# Patient Record
Sex: Male | Born: 1935 | Race: White | Hispanic: No | Marital: Single | Smoking: Never smoker
Health system: Southern US, Community
[De-identification: ages and names within clinical notes are randomized; demographics above are authoritative.]

## PROBLEM LIST (undated history)

## (undated) DIAGNOSIS — IMO0001 Reserved for inherently not codable concepts without codable children: Secondary | ICD-10-CM

## (undated) DIAGNOSIS — J40 Bronchitis, not specified as acute or chronic: Secondary | ICD-10-CM

## (undated) DIAGNOSIS — J449 Chronic obstructive pulmonary disease, unspecified: Secondary | ICD-10-CM

## (undated) DIAGNOSIS — K649 Unspecified hemorrhoids: Secondary | ICD-10-CM

## (undated) DIAGNOSIS — J45909 Unspecified asthma, uncomplicated: Secondary | ICD-10-CM

## (undated) DIAGNOSIS — E119 Type 2 diabetes mellitus without complications: Secondary | ICD-10-CM

## (undated) DIAGNOSIS — R3911 Hesitancy of micturition: Secondary | ICD-10-CM

## (undated) DIAGNOSIS — R351 Nocturia: Secondary | ICD-10-CM

## (undated) DIAGNOSIS — K219 Gastro-esophageal reflux disease without esophagitis: Secondary | ICD-10-CM

## (undated) HISTORY — PX: NOSE SURGERY: SHX723

## (undated) HISTORY — DX: Type 2 diabetes mellitus without complications: E11.9

## (undated) HISTORY — PX: BACK SURGERY: SHX140

## (undated) HISTORY — DX: Chronic obstructive pulmonary disease, unspecified: J44.9

---

## 2005-08-30 ENCOUNTER — Ambulatory Visit (HOSPITAL_COMMUNITY): Admission: RE | Admit: 2005-08-30 | Discharge: 2005-08-30 | Payer: Self-pay | Admitting: Family Medicine

## 2006-07-03 ENCOUNTER — Ambulatory Visit (HOSPITAL_COMMUNITY): Admission: RE | Admit: 2006-07-03 | Discharge: 2006-07-03 | Payer: Self-pay | Admitting: Family Medicine

## 2006-12-04 ENCOUNTER — Ambulatory Visit (HOSPITAL_COMMUNITY): Admission: RE | Admit: 2006-12-04 | Discharge: 2006-12-04 | Payer: Self-pay | Admitting: Otolaryngology

## 2007-01-06 ENCOUNTER — Ambulatory Visit (HOSPITAL_COMMUNITY): Admission: RE | Admit: 2007-01-06 | Discharge: 2007-01-06 | Payer: Self-pay | Admitting: Otolaryngology

## 2007-02-28 ENCOUNTER — Ambulatory Visit (HOSPITAL_COMMUNITY): Admission: RE | Admit: 2007-02-28 | Discharge: 2007-02-28 | Payer: Self-pay | Admitting: Otolaryngology

## 2007-05-26 ENCOUNTER — Ambulatory Visit (HOSPITAL_COMMUNITY): Admission: RE | Admit: 2007-05-26 | Discharge: 2007-05-26 | Payer: Self-pay | Admitting: *Deleted

## 2007-06-02 ENCOUNTER — Ambulatory Visit (HOSPITAL_BASED_OUTPATIENT_CLINIC_OR_DEPARTMENT_OTHER): Admission: RE | Admit: 2007-06-02 | Discharge: 2007-06-02 | Payer: Self-pay | Admitting: Otolaryngology

## 2007-06-02 ENCOUNTER — Encounter (INDEPENDENT_AMBULATORY_CARE_PROVIDER_SITE_OTHER): Payer: Self-pay | Admitting: Otolaryngology

## 2008-08-06 IMAGING — CT CT PARANASAL SINUSES LIMITED
1 series · 10 of 12 positions shown, 13 images · non-contrast
Comparison: 12/04/06.

CLINICAL DATA: Followup chronic sinusitis and polyposis.  
 LIMITED CT OF PARANASAL SINUSES:
TECHNIQUE: Limited axial CT images were obtained through the paranasal sinuses without intravenous contrast.

[Series 3: sinus supine 5.0 h31s · axial · 0.38mm/px · z∈[-4,+86]mm · 10 of 12 slices shown, 13 images]
[im 2/12  brain]
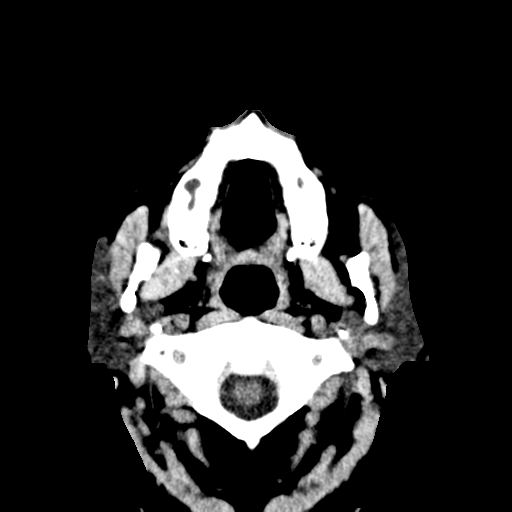
[im 2/12  bone]
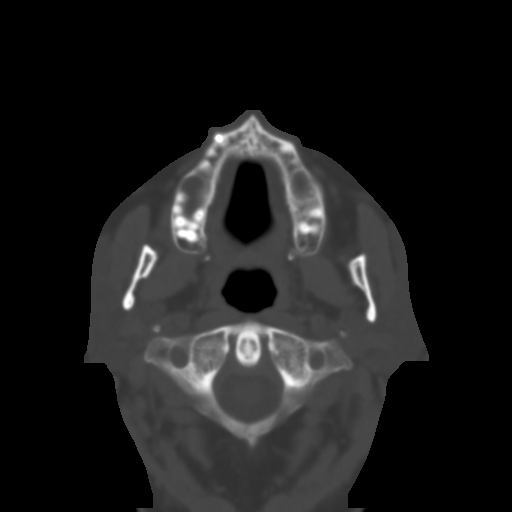
[im 3/12  bone]
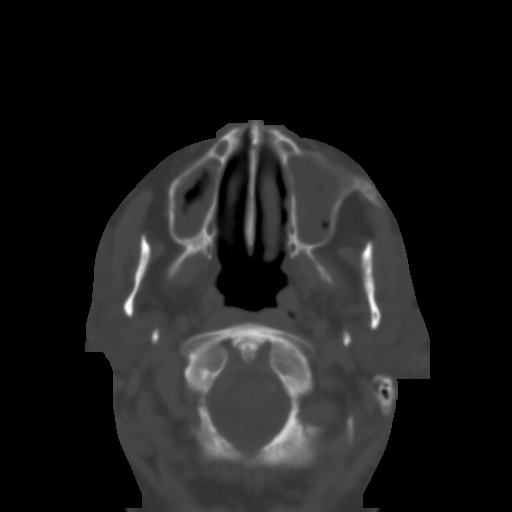
[im 4/12  bone]
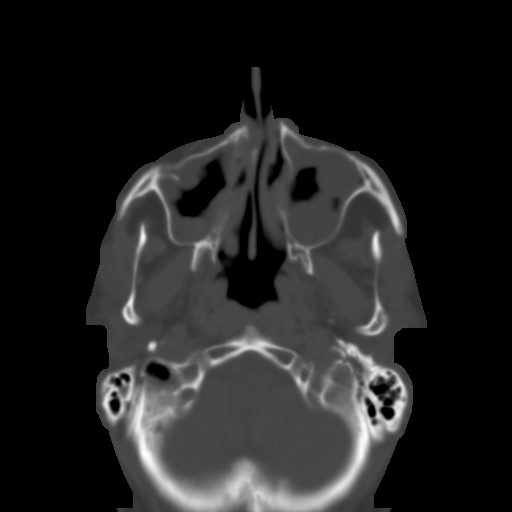
[im 5/12  bone]
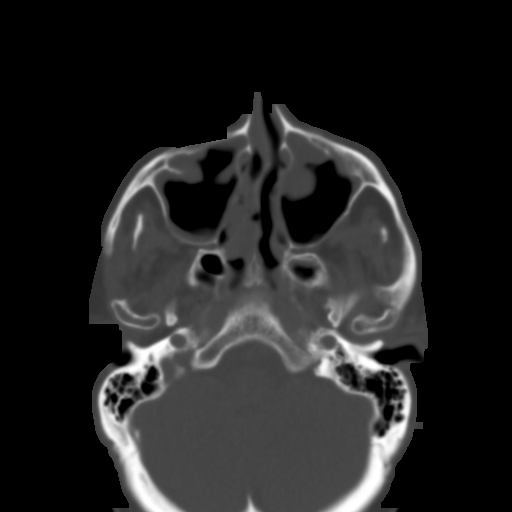
[im 6/12  brain]
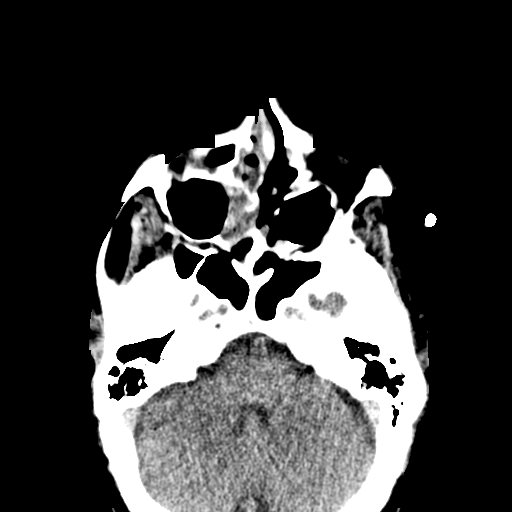
[im 6/12  bone]
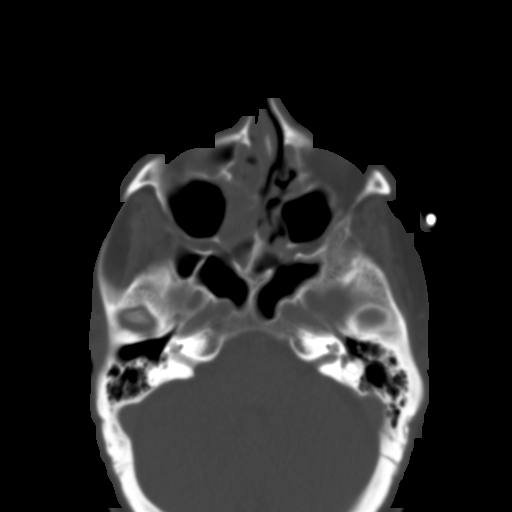
[im 7/12  bone]
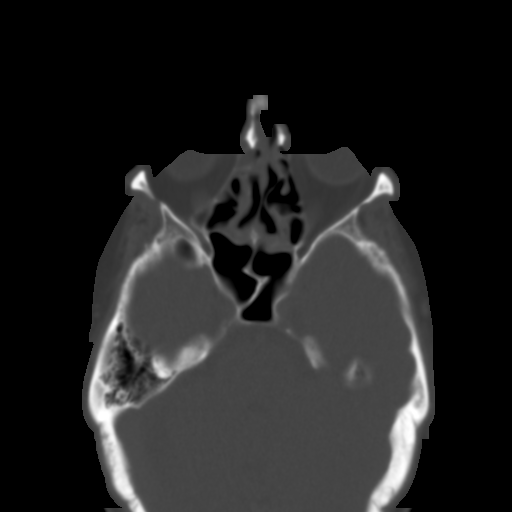
[im 8/12  bone]
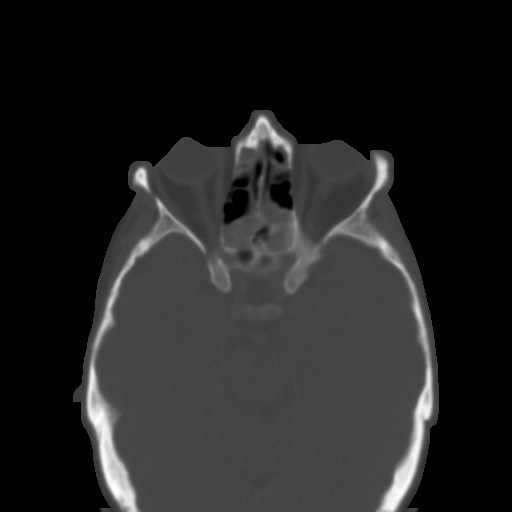
[im 9/12  bone]
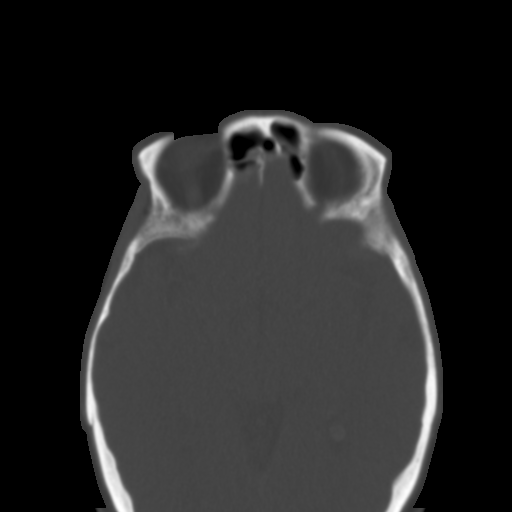
[im 10/12  brain]
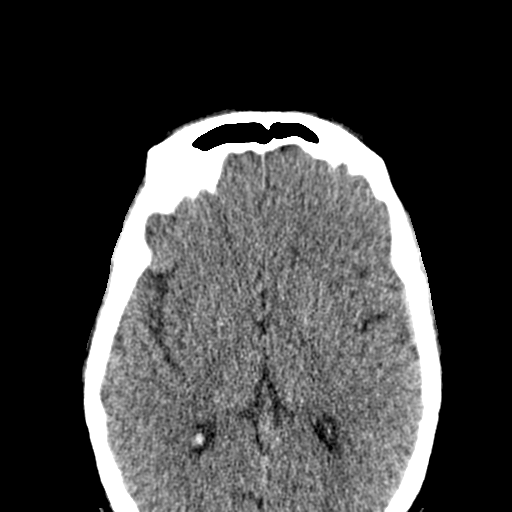
[im 10/12  bone]
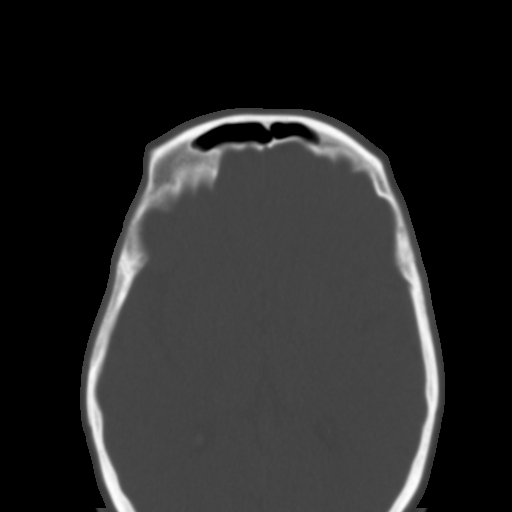
[im 11/12  bone]
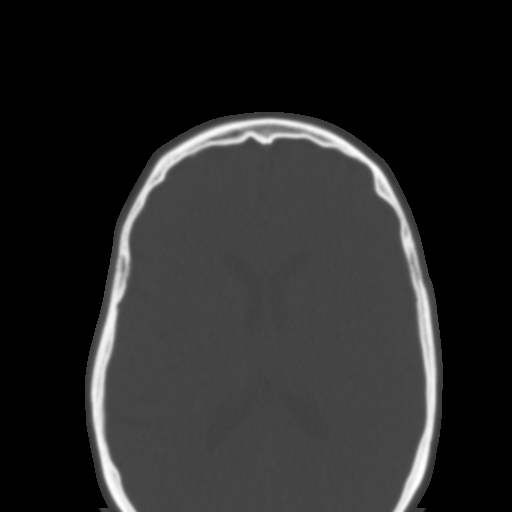

[10 of 12 positions shown; findings below may reference images not displayed]

FINDINGS: Diffuse mucosal thickening is again seen involving the maxillary and ethmoid sinuses bilaterally with multiple rounded areas suggesting mucous retention cysts or polyps.  This has not significantly changed in appearance since the prior study.  Mild mucosal thickening is seen involving the right sphenoid sinus, which is stable.  Frontal sinuses remain clear.  
 There is no evidence of sinus air fluid levels.  The mastoids are clear.  Nasal septal deviation to the right is again noted, as well as old left orbital and facial bone fractures.
IMPRESSION: No significant change in chronic sinusitis, mainly involving the maxillary and ethmoid sinuses bilaterally.  Probable sinus polyposis versus mucous retention cysts again noted.

## 2008-09-28 IMAGING — CT CT PARANASAL SINUSES LIMITED
1 series · 11 of 13 positions shown, 14 images · non-contrast
Comparison: none

HISTORY: Sinusitis, followup after treatment

[Series 4: sinus supine 5.0 h31s · axial · 0.36mm/px · z∈[+61,+161]mm · 11 of 13 slices shown, 14 images]
[im 2/13  brain]
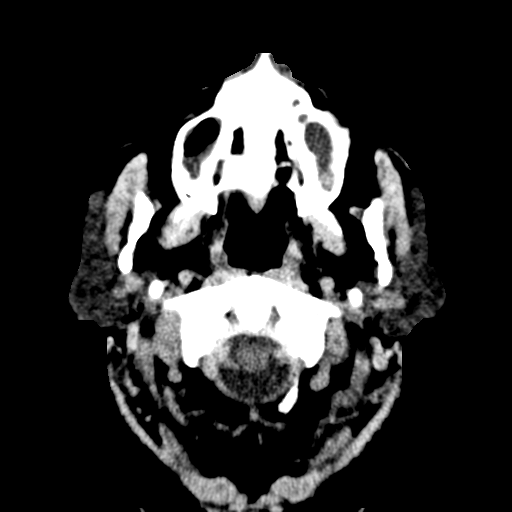
[im 2/13  bone]
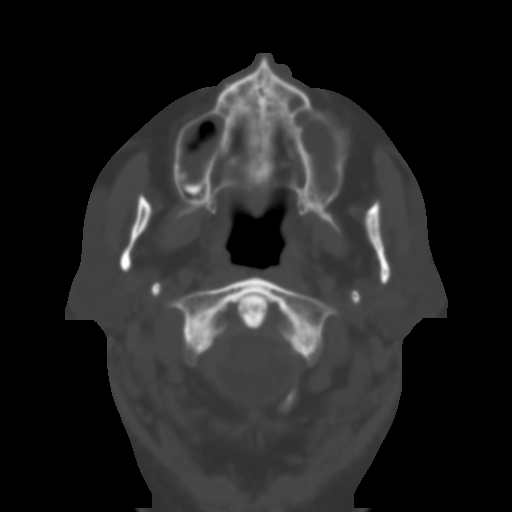
[im 3/13  bone]
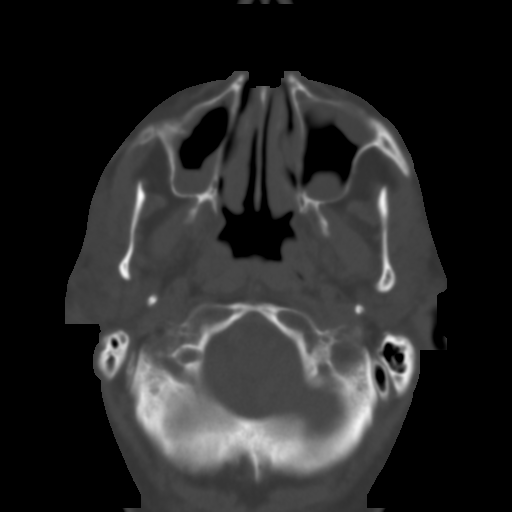
[im 4/13  bone]
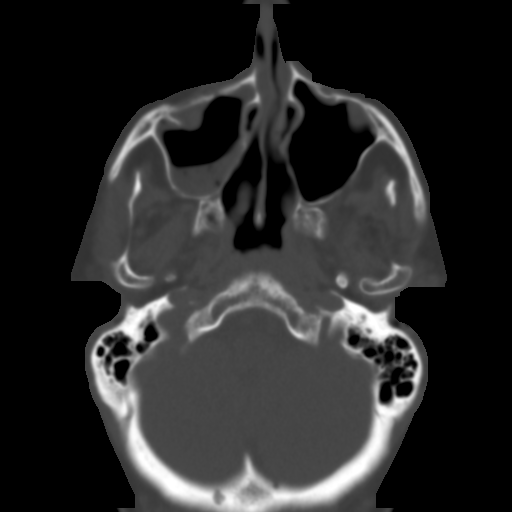
[im 5/13  bone]
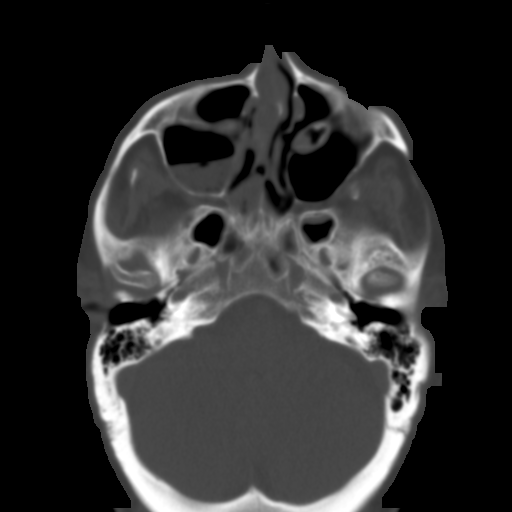
[im 6/13  brain]
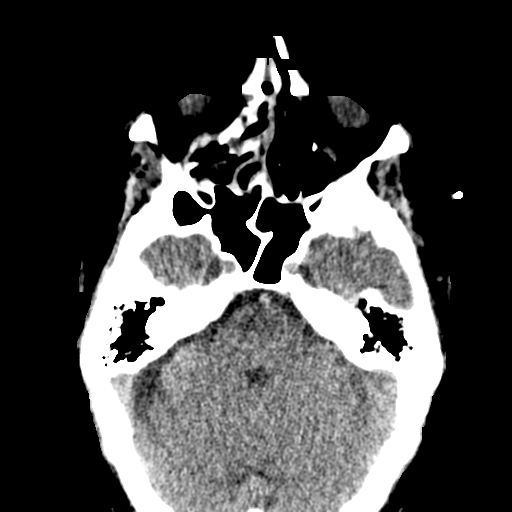
[im 6/13  bone]
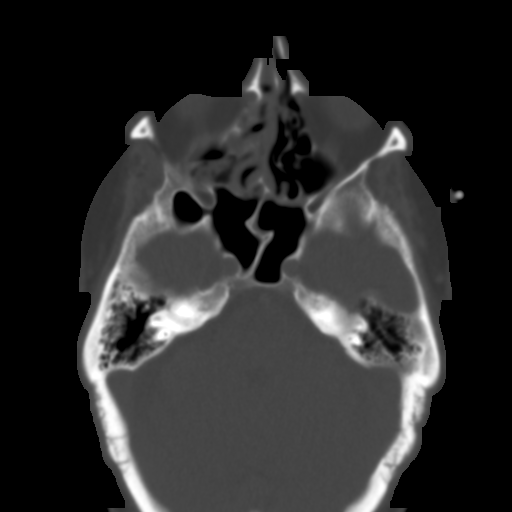
[im 7/13  bone]
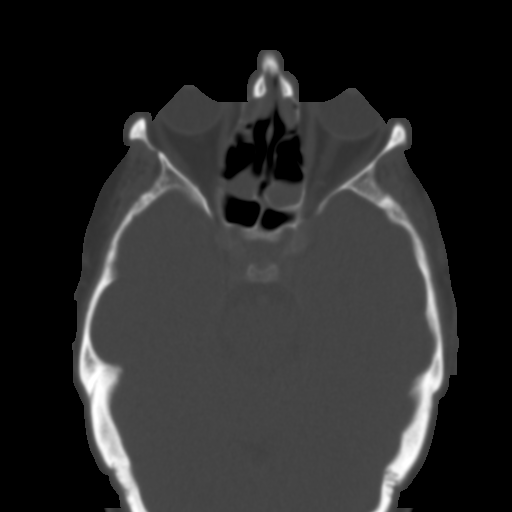
[im 8/13  bone]
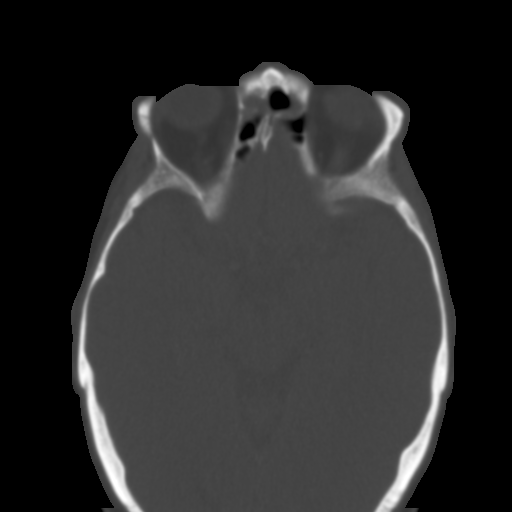
[im 9/13  bone]
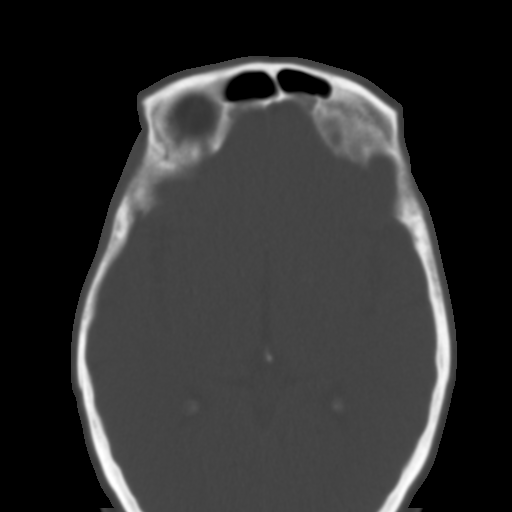
[im 10/13  brain]
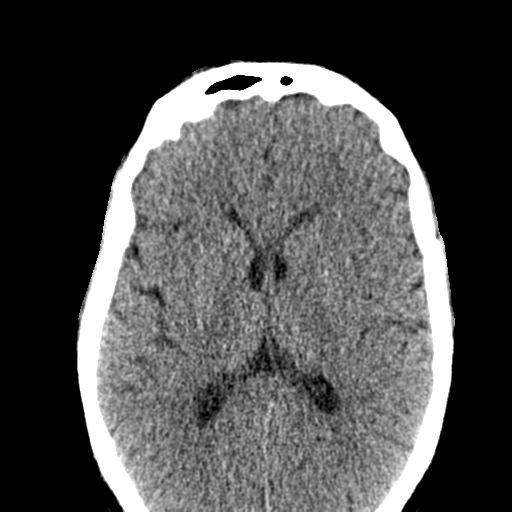
[im 10/13  bone]
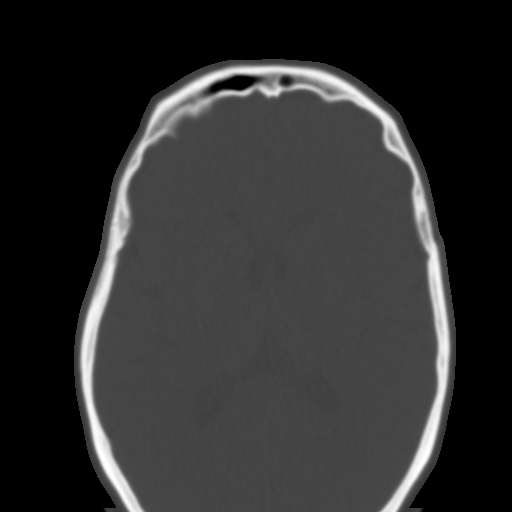
[im 11/13  bone]
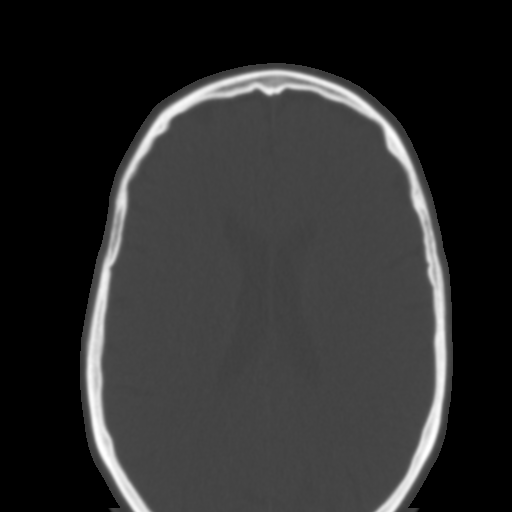
[im 12/13  bone]
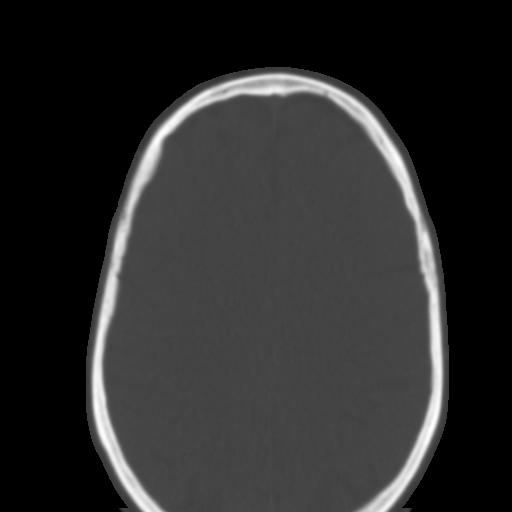

[11 of 13 positions shown; findings below may reference images not displayed]

CT SINUSES LIMITED WITHOUT CONTRAST:

Multidetector noncontrast noncontiguous axial CT images of the paranasal sinuses
performed.
Comparison 01/06/2007.

Significant mucosal thickening in left maxillary sinus with sinus air-fluid
level.
Nodular mucosal thickening or mucosal retention cysts vs. polyps in right
maxillary sinus.
Persistent mucosal thickening and partial opacification of ethmoid air cells
bilaterally.
Minimal mucosal thickening in sphenoid sinus, improved.
No fracture or bone destruction.
Mastoid air cells and middle ear cavities appear clear.
IMPRESSION: Less mucosal thickening in right maxillary sinus and in ethmoid air cells since
previous study.
Partial opacification of ethmoid air cells and maxillary sinuses bilaterally
with new air-fluid level in left maxillary sinus since previous exam.

## 2010-11-14 NOTE — Op Note (Signed)
NAMEBRYSUN, ESCHMANN NO.:  [id number]   MEDICAL RECORD NO.:  [id number]          PATIENT TYPE:  AMB   LOCATION:  DSC                          FACILITY:  [facility]   PHYSICIAN:  Karol T. Lazarus Salines, M.D. DATE OF BIRTH:  [birth year]   DATE OF PROCEDURE:  06/02/2007  DATE OF DISCHARGE:                               OPERATIVE REPORT   PREOPERATIVE DIAGNOSES:  1. Nasal septal deviation with obstruction.  2. Bilateral hypertrophic inferior turbinates with obstruction.  3. Left piriform aperture stenosis posttraumatic.  4. Bilateral chronic pansinusitis, polypoid.   POSTOPERATIVE DIAGNOSES:  1. Nasal septal deviation with obstruction.  2. Bilateral hypertrophic inferior turbinates with obstruction.  3. Left piriform aperture stenosis posttraumatic.  4. Bilateral chronic pansinusitis, polypoid.   PROCEDURES PERFORMED:  1. Nasal septoplasty.  2. Bilateral SMR inferior turbinates.  3. Bilateral endoscopic complete ethmoidectomy.  4. Bilateral endoscopic antrostomy.  5. Left piriform aperture enlargement.   SURGEON:  Gloris Manchester. Lazarus Salines, M.D.   ANESTHESIA:  General orotracheal.   BLOOD LOSS:  25 mL.   COMPLICATIONS:  None.   FINDINGS:  An overall narrow and rightward deviated external nasal  dorsum.  Internally, a rightward septal deviation with a prominent  leftward maxillary crest and chondroethmoidal spur.  Very large inferior  turbinates.  Narrow middle turbinates.  Polypoid mucosal thickening  throughout the ethmoid block and maxillary sinuses on both sides.  Some  internal rotation and narrowing of the lateral left piriform aperture.   PROCEDURE:  With the patient in the comfortable supine position, general  orotracheal anesthesia was induced without difficulty.  At an  appropriate level, the patient was placed in a semisitting position.  The head was rotated to the right.  A saline moistened throat pack was  placed.  Nasal vibrissae were trimmed.   Xylocaine  4% with phenylephrine solution was applied on cotton pledgets  to both sides of the nasal septum.  Xylocaine 1% with 1:100,000  epinephrine, 12 mL total, was infiltrated into the submucoperichondrial  plane of the septum on both side, into the nasal spine region, into the  anterior floor of the nose on both sides, into the membranous columella,  and into the anterior pole of the inferior turbinate on each side.  Several minutes were allowed for this to take effect.  A sterile  preparation and draping of the mid face was accomplished.   The materials were removed from the nose and proved to be intact and  correct in number.  The findings were as described above.  A small right  sided floor incision was executed.  A left hemitransfixion incision was  executed and carried down to a floor incision.  Floor tunnels were  elevated on both sides, carried posteriorly, brought medial to the  maxillary crest and brought forward.  The submucoperichondrial plane of  the left septum was elevated, carried up to the dorsum of the nose, back  onto the perpendicular blade and brought downward and communicated with  the floor tunnel.  There was a small linear rent overlying the  chondroethmoid spur.   The  chondroethmoid junction was identified and opened with a caudal  elevator and the opposite submucoperiosteal plane was elevated up to the  dorsum of the nose and then carried down with the perpendicular plate.  The superior perpendicular plate was lysed with an open Jansen-Middleton  forceps.  The mid portion was dissected free, submucosally rocked free  and delivered with closed Jansen-Middleton forceps and Takahashi  forceps.  The chondroethmoid spur was carefully removed.  The posterior-  inferior corner of the quadrangular cartilage was removed and the  maxillary crest which was deviated towards the left posterior to this  point was removed with a mallet and osteotome.  Two millimeters of the   inferior caudal strut were removed to allow the septum to trap door into  the midline.  This completed the septoplasty.  The septum was secured to  the nasal spine with a figure-of-eight 4-0 PDS suture.  The flaps were  not closed at this point.   Beginning on the right side, additional Xylocaine with phenylephrine  pledgets were placed into the middle meatus and between the middle  turbinate and the septum.  Several minutes were allowed for this to take  effect.  Using a 25 gauge spinal needle, additional 1% Xylocaine with  1:100,000 epinephrine was infiltrated into the middle turbinate and into  the lateral wall of the nose.   The materials were removed from the nose and observed to be intact and  correct in number.  The 0 degree nasal endoscope was introduced.  Using  a sharp sickle knife, the uncinate process was identified and then lysed  and removed with a power debrider.  The anterior face of the bulla  ethmoidalis is opened with a punch forceps and the bony partitions were  removed with punch forceps and the power debrider.  The horizontal  lamella of the middle turbinate was allowed to remain in place, although  the turbinate itself was quite floppy.  The dissection was carried  inferiorly and medially through the vertical lamella of the middle  turbinate and back into the posterior ethmoid cells, working from  medially to laterally and from inferiorly to superiorly, bony partitions  were broken down.  A large natural ostium was identified with some  polypoid changes and this was opened in a posterior to anterior  direction and cleaned with a power debrider.  Using this as the landmark  for the lamina papyracea, the dissection was carried up along the lamina  which was not violated to the level of the fovea ethmoidalis and the  bony partitions were removed.   Switching to a 30-degree telescope to allow better visualization in the  agger nasi region, the cells were broken down  with a suction tip and  bone partitions and mucosa were removed with punch forceps and with the  power debrider.  The inferior aspect of the nasal frontal recess was  identified as patent but was not further disturbed.  At this point, bone  chips were carefully removed.  Hemostasis was spontaneous.  There was  slight bulging of the thin lamina papyracea bone with compression of the  orbit, but there was no actual orbital fat and there was an intact bone  covering throughout.  At this point, the right sinus surgery portion of  the procedure was completed.   The left side was performed in an identical fashion removing the  uncinate process, removing some polyps in the middle meatus, opening the  bulla ethmoidalis but leaving the horizontal  lamella of the middle  turbinate intact, working through the vertical lamella and into the  posterior ethmoid cells, opening the antrostomy posteriorly and  anteriorly and then working from known landmarks up to the fovea  ethmoidalis and along the lamina papyracea, and then switching  telescopes, the agger nasi cells were also opened.  Once again, no  evidence of spinal fluid leak, damage to the fovea or damage to the  lamina papyracea was noted on either side.  No attempt was made to open  the sphenoid on either side.  Hemostasis was spontaneous.  A cotton  pledget with Xylocaine/phenylephrine was placed in the middle meatus.   At this point, the septal tunnels were suctioned clear.  There were  several rents on the right side.  The septal incisions were closed with  interrupted 4-0 chromic suture.   Just before completing the septoplasty, the inferior turbinates were  infiltrated with additional 1% Xylocaine with 1:100,000 epinephrine.  Following this, just behind the nasal valve, the anterior hood of the  right inferior turbinate was lysed and the medial mucosa of the  turbinate was incised in an anterior upsloping fashion and a laterally  based  flap was developed.  The turbinate was infractured.  The turbinate  was resected in a posterior downsloping fashion taking turbinate bone  and lateral mucosa and leaving most of the medial mucosa.  There was a  bulbous posterior pole which was suctioned coagulated as were the cut  mucosal edges.  Bony spicules were removed for more prompt healing.  The  flap was laid back down, the turbinate was outfracture and this side was  completed.  The left side was done in identical fashion.   After completing the left turbinectomy, the soft tissue was dissected  away from the piriform aperture and some stenosis in this area was  removed with a mallet and osteotome and the bone particles were  discarded.  The soft tissue fell back down across this area of bone  resection.   At this point, the 0.040 reinforced Silastic splints were fashioned and  placed against the septum on both sides and secured there too with a 3-0  Ethilon stitch.  A triple thickness Telfa pack impregnated with  bacitracin ointment with a 2-0 silk tag was placed into the middle  meatus on both sides.  An additional pack was placed between the  turbinate and the septum on both sides for stabilization of the septum  and for hemostasis.  Hemostasis was observed.  Note that the last cotton  pledgets had been removed and the ethmoid blocks reinspected prior to  packing.  At this point, the procedure was completed.  The pharynx was  suctioned clean and the throat pack was removed.  The patient was  returned to anesthesia, awakened, extubated and transferred to recovery  in stable condition.   COMMENT:  A 75 year old white male with a long history of recurrent  sinusitis and a history of poor nasal airway, made worse following a  facial trauma roughly one year ago with several indications for today's  procedure.  Anticipate a routine postoperative recovery with attention  to ice, elevation, analgesia, antibiosis.  We will remove the  nasal  packing in 1-2 days and then institute nasal hygiene measures.  I would  like to leave the Silastic splints in place approximately 14 days and  then will begin sinus cleaning.  Given low anticipated risk of  postanesthetic or postsurgical complications, feel an outpatient venue  is appropriate.      Gloris Manchester. Lazarus Salines, M.D.  Electronically Signed     KTW/MEDQ  D:  06/02/2007  T:  06/02/2007  Job:  [id number]   cc:   Zola Button T. Lazarus Salines, M.D.  Patrica Duel, M.D.  Janith Lima, MD

## 2014-02-11 ENCOUNTER — Encounter (HOSPITAL_COMMUNITY): Payer: Self-pay | Admitting: Emergency Medicine

## 2014-02-11 ENCOUNTER — Emergency Department (HOSPITAL_COMMUNITY)
Admission: EM | Admit: 2014-02-11 | Discharge: 2014-02-11 | Disposition: A | Discharge status: Home / Self Care | Payer: Medicare Other | Attending: Emergency Medicine | Admitting: Emergency Medicine

## 2014-02-11 DIAGNOSIS — R011 Cardiac murmur, unspecified: Secondary | ICD-10-CM | POA: Diagnosis not present

## 2014-02-11 DIAGNOSIS — Z8709 Personal history of other diseases of the respiratory system: Secondary | ICD-10-CM | POA: Insufficient documentation

## 2014-02-11 DIAGNOSIS — H6120 Impacted cerumen, unspecified ear: Secondary | ICD-10-CM | POA: Diagnosis not present

## 2014-02-11 DIAGNOSIS — H9209 Otalgia, unspecified ear: Secondary | ICD-10-CM | POA: Insufficient documentation

## 2014-02-11 DIAGNOSIS — H6123 Impacted cerumen, bilateral: Secondary | ICD-10-CM

## 2014-02-11 HISTORY — DX: Bronchitis, not specified as acute or chronic: J40

## 2014-02-11 MED ORDER — NEOMYCIN-POLYMYXIN-HC 1 % OT SOLN
3.0000 [drp] | Freq: Once | OTIC | Status: AC
  Administered 2014-02-11: 3 [drp] via OTIC
  Filled 2014-02-11: qty 10

## 2014-02-11 NOTE — ED Notes (Signed)
Removed several large chunks of cerumen from both ears. Irrigation was successful; PA was able to visualize tympanic membrane bilaterally.

## 2014-02-11 NOTE — ED Provider Notes (Signed)
Medical screening examination/treatment/procedure(s) were performed by non-physician practitioner and as supervising physician I was immediately available for consultation/collaboration.   EKG Interpretation None       Nat Christen, MD 02/11/14 1527

## 2014-02-11 NOTE — Discharge Instructions (Signed)
Please drop 3 drops of the Cortisporin otic suspension in both ears with breakfast lunch dinner and bedtime for the next 5-7 days. Use Tylenol for any discomfort. Please return to the emergency department if any high fever, unusual pain, or changes in your condition. Cerumen Impaction A cerumen impaction is when the wax in your ear forms a plug. This plug usually causes reduced hearing. Sometimes it also causes an earache or dizziness. Removing a cerumen impaction can be difficult and painful. The wax sticks to the ear canal. The canal is sensitive and bleeds easily. If you try to remove a heavy wax buildup with a cotton tipped swab, you may push it in further. Irrigation with water, suction, and small ear curettes may be used to clear out the wax. If the impaction is fixed to the skin in the ear canal, ear drops may be needed for a few days to loosen the wax. People who build up a lot of wax frequently can use ear wax removal products available in your local drugstore. SEEK MEDICAL CARE IF:  You develop an earache, increased hearing loss, or marked dizziness. Document Released: 07/26/2004 Document Revised: 09/10/2011 Document Reviewed: 09/15/2009 [facility] Patient Information 2015 [place], [place]. This information is not intended to replace advice given to you by your health care provider. Make sure you discuss any questions you have with your health care provider.

## 2014-02-11 NOTE — ED Provider Notes (Signed)
CSN: [id number]     Arrival date & time 02/11/14  0903 History   First MD Initiated Contact with Patient 02/11/14 0935     Chief Complaint  Patient presents with  . Ear Problem     (Consider location/radiation/quality/duration/timing/severity/associated sxs/prior Treatment) HPI Comments: This is a 78 year old male who presents to the emergency department with complaint of" both ears are stopped up". The patient states for the last 4 days he's been having a" a muffled sound sensation" he states that he has particular problems with some things are very low pitch. He noted a small amount of drainage, but he did not know of blood or pus. He's not had any high fever. There's been no unusual headache. His been no pain or redness that the patient has noticed behind the ears. He has not had any recent operations or procedures involving the ears. Patient states he has not had any change in medications, and has not had any medications that he was told could be toxic to his ears.  The history is provided by the patient.    Past Medical History  Diagnosis Date  . Bronchitis    Past Surgical History  Procedure Laterality Date  . Back surgery     No family history on file. History  Substance Use Topics  . Smoking status: Never Smoker   . Smokeless tobacco: Not on file  . Alcohol Use: No    Review of Systems  Constitutional: Negative for activity change.       All ROS Neg except as noted in HPI  HENT: Positive for ear pain and sinus pressure. Negative for nosebleeds.   Eyes: Negative for photophobia and discharge.  Respiratory: Positive for cough. Negative for shortness of breath and wheezing.   Cardiovascular: Negative for chest pain and palpitations.  Gastrointestinal: Negative for abdominal pain and blood in stool.  Genitourinary: Negative for dysuria, frequency and hematuria.  Musculoskeletal: Negative for arthralgias, back pain and neck pain.  Skin: Negative.   Neurological: Negative  for dizziness, seizures and speech difficulty.  Psychiatric/Behavioral: Negative for hallucinations and confusion.      Allergies  Aspirin  Home Medications   Prior to Admission medications   Medication Sig Start Date End Date Taking? Authorizing Provider  albuterol (PROVENTIL HFA;VENTOLIN HFA) 108 (90 BASE) MCG/ACT inhaler Inhale 2 puffs into the lungs every 6 (six) hours as needed for wheezing or shortness of breath.   Yes Historical Provider, MD   BP 149/77  Pulse 80  Temp(Src) 97.6 F (36.4 C) (Oral)  Resp 16  Ht 6' (1.829 m)  Wt 160 lb (72.576 kg)  BMI 21.70 kg/m2  SpO2 94% Physical Exam  Nursing note and vitals reviewed. Constitutional: He is oriented to person, place, and time. He appears well-developed and well-nourished.  Non-toxic appearance.  HENT:  Head: Normocephalic.  Right Ear: Tympanic membrane and external ear normal.  Left Ear: Tympanic membrane and external ear normal.  There is no pain with movement of the external ear. There's no redness or swelling behind the right or left year. The portion of the external auditory canal that can be seen as without problem.  Eyes: EOM and lids are normal. Pupils are equal, round, and reactive to light.  Neck: Normal range of motion. Neck supple. Carotid bruit is not present.  Cardiovascular: Normal rate, regular rhythm, intact distal pulses and normal pulses.   Murmur heard. Pulmonary/Chest: Breath sounds normal. No respiratory distress.  Abdominal: Soft. Bowel sounds are normal. There is  no tenderness. There is no guarding.  Musculoskeletal: Normal range of motion.  Lymphadenopathy:       Head (right side): No submandibular adenopathy present.       Head (left side): No submandibular adenopathy present.    He has no cervical adenopathy.  Neurological: He is alert and oriented to person, place, and time. He has normal strength. No cranial nerve deficit or sensory deficit.  Skin: Skin is warm and dry.  Psychiatric: He  has a normal mood and affect. His speech is normal.    ED Course Both ears irrigated by nursing staff.   Patient seen with me by Dr. Lacinda Axon.   Procedures (including critical care time) Labs Review Labs Reviewed - No data to display  Imaging Review No results found.   EKG Interpretation None      MDM Vital signs are well within normal limits. Pulse oximetry is 94% on room air. Within normal limits by my interpretation.  After the ears were ear gated and significant wax impaction was removed, the right external auditory canal was within normal limits. The tympanic membrane was without problem.  There is a small scratch in the left external auditory canal. A significant amount of the wax impaction has been removed. Only a portion of the tympanic membrane could be seen, and it was it was without problem. The patient states that he can hear much better and that he feels much better.  The plan at this time is for the patient to be given Cortisporin otic suspension 3 drops 3 times daily over the next 5-7 days. The patient is advised to return to the emergency department immediately if any changes, problems, or concerns.    Final diagnoses:  None    *I have reviewed nursing notes, vital signs, and all appropriate lab and imaging results for this patient.Lenox Ahr, PA-C 02/11/14 1125

## 2014-02-11 NOTE — ED Notes (Signed)
Pt states "Both ears are stopped up" x 3-4 days. States, "only a little pain" and also states everything sounds muffled. NAD.

## 2015-07-11 DIAGNOSIS — J40 Bronchitis, not specified as acute or chronic: Secondary | ICD-10-CM | POA: Diagnosis not present

## 2015-07-11 DIAGNOSIS — J449 Chronic obstructive pulmonary disease, unspecified: Secondary | ICD-10-CM | POA: Diagnosis not present

## 2015-07-11 DIAGNOSIS — K210 Gastro-esophageal reflux disease with esophagitis: Secondary | ICD-10-CM | POA: Diagnosis not present

## 2015-07-11 DIAGNOSIS — N401 Enlarged prostate with lower urinary tract symptoms: Secondary | ICD-10-CM | POA: Diagnosis not present

## 2015-07-11 DIAGNOSIS — E1165 Type 2 diabetes mellitus with hyperglycemia: Secondary | ICD-10-CM | POA: Diagnosis not present

## 2015-07-27 DIAGNOSIS — R972 Elevated prostate specific antigen [PSA]: Secondary | ICD-10-CM | POA: Diagnosis not present

## 2015-07-27 DIAGNOSIS — R350 Frequency of micturition: Secondary | ICD-10-CM | POA: Diagnosis not present

## 2015-07-27 DIAGNOSIS — R351 Nocturia: Secondary | ICD-10-CM | POA: Diagnosis not present

## 2015-07-27 DIAGNOSIS — N401 Enlarged prostate with lower urinary tract symptoms: Secondary | ICD-10-CM | POA: Diagnosis not present

## 2015-07-27 DIAGNOSIS — Z0000 Encounter for general adult medical examination without abnormal findings: Secondary | ICD-10-CM | POA: Diagnosis not present

## 2015-08-02 ENCOUNTER — Other Ambulatory Visit (HOSPITAL_COMMUNITY): Payer: Self-pay | Admitting: Urology

## 2015-08-02 DIAGNOSIS — R972 Elevated prostate specific antigen [PSA]: Secondary | ICD-10-CM

## 2015-08-04 ENCOUNTER — Encounter: Payer: PPO | Attending: Pulmonary Disease | Admitting: Nutrition

## 2015-08-04 ENCOUNTER — Encounter: Payer: Self-pay | Admitting: Nutrition

## 2015-08-04 VITALS — Ht 72.0 in | Wt 153.0 lb

## 2015-08-04 DIAGNOSIS — IMO0002 Reserved for concepts with insufficient information to code with codable children: Secondary | ICD-10-CM

## 2015-08-04 DIAGNOSIS — E1165 Type 2 diabetes mellitus with hyperglycemia: Secondary | ICD-10-CM | POA: Insufficient documentation

## 2015-08-04 DIAGNOSIS — E118 Type 2 diabetes mellitus with unspecified complications: Secondary | ICD-10-CM

## 2015-08-04 NOTE — Progress Notes (Signed)
Diabetes Self-Management Education  Visit Type: First/Initial  Appt. Start Time: 1500 pm  End Time: 1600  08/05/2015  [name], identified by name and date of birth, is a 80 y.o. male with a diagnosis of Diabetes: Type 2.    ASSESSMENT  Primary concerns today: Diabetes, Type 2 DM, Lives with his sister and brother in law. HIs ster and brother do the cooking and shopping.  A1C 10%. Has cut candy and sweets since finding out about DM. Tests blood sugars in am 120's mg/dl.  Last BS abg 119. Mos:t foods are baked and broiled and some fried. Taking Metformin 500 mg BID. Since he cut out sweets etc, he notes his BS in am are below 120 mg/dl now all the time. Eating two- three meals per day. 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 Was eating a lot of sweets and drinking sodas in the past. Cut them out. 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 Diet has been excessive in carbs at times and insuffient at other times.  Needs more fresh fruits and vegetables and whole grains.  Height 6' (1.829 m), weight 153 lb (69.4 kg). Body mass index is 20.75 kg/(m^2).      Diabetes Self-Management Education - 08/04/15 0830    Visit Information   Visit Type First/Initial   Initial Visit   Diabetes Type Type 2   Are you currently following a meal plan? Yes   What type of meal plan do you follow? low sugar   Are you taking your medications as prescribed? Yes   Date Diagnosed a few months ago.   Health Coping   How would you rate your overall health? Good   Psychosocial Assessment   Patient Belief/Attitude about Diabetes Motivated to manage diabetes   Self-care barriers None   Self-management support Family   Other persons present Patient   Patient Concerns Nutrition/Meal planning;Healthy Lifestyle;Glycemic Control   Special Needs None   Preferred Learning Style No preference indicated   Learning Readiness Ready   How often do you need to have someone help you when you  read instructions, pamphlets, or other written materials from your doctor or pharmacy? 1 - Never   What is the last grade level you completed in school? 12   Complications   Last HgB A1C per patient/outside source 10 %   How often do you check your blood sugar? 1-2 times/day   Fasting Blood glucose range (mg/dL) 70-129   Postprandial Blood glucose range (mg/dL) 70-129   Number of hypoglycemic episodes per month 0   Number of hyperglycemic episodes per week 0   Have you had a dilated eye exam in the past 12 months? Yes   Have you had a dental exam in the past 12 months? Yes   Are you checking your feet? Yes   Dietary Intake   Breakfast eggs, oatmeal, and fruit   Lunch sometimes skips if on farm working; sandwich, Copywriter, advertising, few vegetales, water   Snack (evening) fruit   Beverage(s) water   Exercise   Exercise Type Light (walking / raking leaves)   How many days per week to you exercise? 5   How many minutes per day do you exercise? 30   Total minutes per week of exercise 150   Patient Education   Previous Diabetes Education No   Disease state  Definition of diabetes, type 1 and 2, and the diagnosis of diabetes;Factors that  contribute to the development of diabetes;Explored patient's options for treatment of their diabetes   Nutrition management  Role of diet in the treatment of diabetes and the relationship between the three main macronutrients and blood glucose level;Food label reading, portion sizes and measuring food.;Carbohydrate counting;Reviewed blood glucose goals for pre and post meals and how to evaluate the patients' food intake on their blood glucose level.;Meal timing in regards to the patients' current diabetes medication.;Information on hints to eating out and maintain blood glucose control.;Meal options for control of blood glucose level and chronic complications.   Physical activity and exercise  Role of exercise on diabetes management, blood pressure control and  cardiac health.;Helped patient identify appropriate exercises in relation to his/her diabetes, diabetes complications and other health issue.;Identified with patient nutritional and/or medication changes necessary with exercise.   Medications Reviewed patients medication for diabetes, action, purpose, timing of dose and side effects.   Monitoring Purpose and frequency of SMBG.;Taught/discussed recording of test results and interpretation of SMBG.;Identified appropriate SMBG and/or A1C goals.   Acute complications Taught treatment of hypoglycemia - the 15 rule.   Chronic complications Relationship between chronic complications and blood glucose control;Assessed and discussed foot care and prevention of foot problems   Psychosocial adjustment Identified and addressed patients feelings and concerns about diabetes;Worked with patient to identify barriers to care and solutions   Personal strategies to promote health Lifestyle issues that need to be addressed for better diabetes care   Individualized Goals (developed by patient)   Nutrition Follow meal plan discussed;General guidelines for healthy choices and portions discussed;Adjust meds/carbs with exercise as discussed   Physical Activity Exercise 3-5 times per week   Monitoring  test my blood glucose as discussed;test blood glucose before bed, 3 a.m., and fasting   Reducing Risk examine blood glucose patterns;do foot checks daily;increase portions of nuts and seeds;increase portions of healthy fats   Outcomes   Expected Outcomes Demonstrated interest in learning. Expect positive outcomes   Future DMSE 3-4 months   Program Status Completed      Individualized Plan for Diabetes Self-Management Training:   Learning Objective:  Patient will have a greater understanding of diabetes self-management. Patient education plan is to attend individual and/or group sessions per assessed needs and concerns.   Plan:   Patient Instructions  Goals 1. Follow  Plate Method 2. Increase fresh fruits and vegetables. 3. Drink water or unsweet tea 4. Avoid snacks between meals. 5. Eat 3-4 carb choices per meal. 6. Do not skip meals. 7. Get A1C down to 7% in three months. 8. Take Metformin as directed.    Expected Outcomes:  Demonstrated interest in learning. Expect positive outcomes  Education material provided: Living Well with Diabetes, Meal plan card, My Plate and Carbohydrate counting sheet  If problems or questions, patient to contact team via:  Phone  Future DSME appointment: 3-4 months   .

## 2015-08-04 NOTE — Patient Instructions (Signed)
Goals 1. Follow Plate Method 2. Increase fresh fruits and vegetables. 3. Drink water or unsweet tea 4. Avoid snacks between meals. 5. Eat 3-4 carb choices per meal. 6. Do not skip meals. 7. Get A1C down to 7% in three months. 8. Take Metformin as directed.

## 2015-08-05 ENCOUNTER — Encounter: Payer: Self-pay | Admitting: Nutrition

## 2015-08-19 ENCOUNTER — Ambulatory Visit (HOSPITAL_COMMUNITY)
Admission: RE | Admit: 2015-08-19 | Discharge: 2015-08-19 | Disposition: A | Discharge status: Home / Self Care | Payer: PPO | Source: Ambulatory Visit | Attending: Urology | Admitting: Urology

## 2015-08-19 DIAGNOSIS — N429 Disorder of prostate, unspecified: Secondary | ICD-10-CM | POA: Insufficient documentation

## 2015-08-19 DIAGNOSIS — N400 Enlarged prostate without lower urinary tract symptoms: Secondary | ICD-10-CM | POA: Diagnosis not present

## 2015-08-19 DIAGNOSIS — R972 Elevated prostate specific antigen [PSA]: Secondary | ICD-10-CM | POA: Diagnosis not present

## 2015-08-19 LAB — POCT I-STAT CREATININE: CREATININE: 0.9 mg/dL (ref 0.61–1.24)

## 2015-08-19 MED ORDER — GADOBENATE DIMEGLUMINE 529 MG/ML IV SOLN
15.0000 mL | Freq: Once | INTRAVENOUS | Status: AC | PRN
  Administered 2015-08-19: 14 mL via INTRAVENOUS

## 2015-08-23 DIAGNOSIS — R351 Nocturia: Secondary | ICD-10-CM | POA: Diagnosis not present

## 2015-08-23 DIAGNOSIS — N138 Other obstructive and reflux uropathy: Secondary | ICD-10-CM | POA: Diagnosis not present

## 2015-08-23 DIAGNOSIS — Z0000 Encounter for general adult medical examination without abnormal findings: Secondary | ICD-10-CM | POA: Diagnosis not present

## 2015-08-23 DIAGNOSIS — R350 Frequency of micturition: Secondary | ICD-10-CM | POA: Diagnosis not present

## 2015-08-23 DIAGNOSIS — N401 Enlarged prostate with lower urinary tract symptoms: Secondary | ICD-10-CM | POA: Diagnosis not present

## 2015-08-23 DIAGNOSIS — R972 Elevated prostate specific antigen [PSA]: Secondary | ICD-10-CM | POA: Diagnosis not present

## 2015-10-05 ENCOUNTER — Other Ambulatory Visit: Payer: Self-pay | Admitting: Urology

## 2015-10-05 DIAGNOSIS — N138 Other obstructive and reflux uropathy: Secondary | ICD-10-CM | POA: Diagnosis not present

## 2015-10-05 DIAGNOSIS — R972 Elevated prostate specific antigen [PSA]: Secondary | ICD-10-CM | POA: Diagnosis not present

## 2015-10-05 DIAGNOSIS — N401 Enlarged prostate with lower urinary tract symptoms: Secondary | ICD-10-CM | POA: Diagnosis not present

## 2015-10-05 DIAGNOSIS — R351 Nocturia: Secondary | ICD-10-CM | POA: Diagnosis not present

## 2015-10-05 DIAGNOSIS — Z0000 Encounter for general adult medical examination without abnormal findings: Secondary | ICD-10-CM | POA: Diagnosis not present

## 2015-10-05 DIAGNOSIS — R350 Frequency of micturition: Secondary | ICD-10-CM | POA: Diagnosis not present

## 2015-10-06 DIAGNOSIS — N401 Enlarged prostate with lower urinary tract symptoms: Secondary | ICD-10-CM | POA: Diagnosis not present

## 2015-10-06 DIAGNOSIS — M6281 Muscle weakness (generalized): Secondary | ICD-10-CM | POA: Diagnosis not present

## 2015-10-06 DIAGNOSIS — N138 Other obstructive and reflux uropathy: Secondary | ICD-10-CM | POA: Diagnosis not present

## 2015-10-07 ENCOUNTER — Encounter (HOSPITAL_COMMUNITY)
Admission: RE | Admit: 2015-10-07 | Discharge: 2015-10-07 | Disposition: A | Discharge status: Home / Self Care | Payer: PPO | Source: Ambulatory Visit | Attending: Urology | Admitting: Urology

## 2015-10-07 ENCOUNTER — Encounter (HOSPITAL_COMMUNITY): Payer: Self-pay

## 2015-10-07 ENCOUNTER — Other Ambulatory Visit: Payer: Self-pay | Admitting: Urology

## 2015-10-07 DIAGNOSIS — K219 Gastro-esophageal reflux disease without esophagitis: Secondary | ICD-10-CM | POA: Insufficient documentation

## 2015-10-07 DIAGNOSIS — J40 Bronchitis, not specified as acute or chronic: Secondary | ICD-10-CM | POA: Insufficient documentation

## 2015-10-07 DIAGNOSIS — Z01810 Encounter for preprocedural cardiovascular examination: Secondary | ICD-10-CM | POA: Diagnosis not present

## 2015-10-07 DIAGNOSIS — E119 Type 2 diabetes mellitus without complications: Secondary | ICD-10-CM | POA: Diagnosis not present

## 2015-10-07 DIAGNOSIS — Z01812 Encounter for preprocedural laboratory examination: Secondary | ICD-10-CM | POA: Diagnosis not present

## 2015-10-07 DIAGNOSIS — J449 Chronic obstructive pulmonary disease, unspecified: Secondary | ICD-10-CM | POA: Insufficient documentation

## 2015-10-07 DIAGNOSIS — D291 Benign neoplasm of prostate: Secondary | ICD-10-CM | POA: Diagnosis not present

## 2015-10-07 DIAGNOSIS — R0602 Shortness of breath: Secondary | ICD-10-CM | POA: Insufficient documentation

## 2015-10-07 DIAGNOSIS — K649 Unspecified hemorrhoids: Secondary | ICD-10-CM | POA: Insufficient documentation

## 2015-10-07 DIAGNOSIS — J45909 Unspecified asthma, uncomplicated: Secondary | ICD-10-CM | POA: Diagnosis not present

## 2015-10-07 DIAGNOSIS — R351 Nocturia: Secondary | ICD-10-CM | POA: Insufficient documentation

## 2015-10-07 HISTORY — DX: Nocturia: R35.1

## 2015-10-07 HISTORY — DX: Gastro-esophageal reflux disease without esophagitis: K21.9

## 2015-10-07 HISTORY — DX: Unspecified asthma, uncomplicated: J45.909

## 2015-10-07 HISTORY — DX: Unspecified hemorrhoids: K64.9

## 2015-10-07 HISTORY — DX: Hesitancy of micturition: R39.11

## 2015-10-07 HISTORY — DX: Reserved for inherently not codable concepts without codable children: IMO0001

## 2015-10-07 LAB — BASIC METABOLIC PANEL
Anion gap: 8 (ref 5–15)
BUN: 16 mg/dL (ref 6–20)
CALCIUM: 9.2 mg/dL (ref 8.9–10.3)
CO2: 27 mmol/L (ref 22–32)
Chloride: 106 mmol/L (ref 101–111)
Creatinine, Ser: 0.93 mg/dL (ref 0.61–1.24)
GFR calc Af Amer: >60 mL/min (ref >60)
GLUCOSE: 190 mg/dL — AB (ref 65–99)
Potassium: 4.1 mmol/L (ref 3.5–5.1)
Sodium: 141 mmol/L (ref 135–145)

## 2015-10-07 LAB — NO BLOOD PRODUCTS

## 2015-10-07 LAB — CBC
HCT: 45.4 % (ref 39.0–52.0)
Hemoglobin: 15.2 g/dL (ref 13.0–17.0)
MCH: 30.8 pg (ref 26.0–34.0)
MCHC: 33.5 g/dL (ref 30.0–36.0)
MCV: 92.1 fL (ref 78.0–100.0)
PLATELETS: 269 10*3/uL (ref 150–400)
RBC: 4.93 MIL/uL (ref 4.22–5.81)
RDW: 12.9 % (ref 11.5–15.5)
WBC: 6.6 10*3/uL (ref 4.0–10.5)

## 2015-10-07 NOTE — Patient Instructions (Signed)
Damon Navarro  10/07/2015   Your procedure is scheduled on: Friday October 14, 2015  Report to [facility] Main  Entrance take [place]  elevators to 3rd floor to  [place] at 8:00 AM.  Call this number if you have problems the morning of surgery [phone]   Remember: ONLY 1 PERSON MAY GO WITH YOU TO SHORT STAY TO GET  READY MORNING OF [place].  Do not eat food or drink liquids :After Midnight.     Take these medicines the morning of surgery:Symbicort Inhaler (bring with you day of surgery) DO NOT TAKE ANY DIABETIC MEDICATIONS DAY OF YOUR SURGERY                               You may not have any metal on your body including hair pins and              piercings  Do not wear jewelry, lotions, powders or colognes, deodorant                        Men may shave face and neck.   Do not bring valuables to the hospital. [place].  Contacts, dentures or bridgework may not be worn into surgery.  Leave suitcase in the car. After surgery it may be brought to your room. _____________________________________________________________________             [facility] - Preparing for Surgery Before surgery, you can play an important role.  Because skin is not sterile, your skin needs to be as free of germs as possible.  You can reduce the number of germs on your skin by washing with CHG (chlorahexidine gluconate) soap before surgery.  CHG is an antiseptic cleaner which kills germs and bonds with the skin to continue killing germs even after washing. Please DO NOT use if you have an allergy to CHG or antibacterial soaps.  If your skin becomes reddened/irritated stop using the CHG and inform your nurse when you arrive at Short Stay. Do not shave (including legs and underarms) for at least 48 hours prior to the first CHG shower.  You may shave your face/neck. Please follow these instructions carefully:  1.  Shower with  CHG Soap the night before surgery and the  morning of Surgery.  2.  If you choose to wash your hair, wash your hair first as usual with your  normal  shampoo.  3.  After you shampoo, rinse your hair and body thoroughly to remove the  shampoo.                           4.  Use CHG as you would any other liquid soap.  You can apply chg directly  to the skin and wash                       Gently with a scrungie or clean washcloth.  5.  Apply the CHG Soap to your body ONLY FROM THE NECK DOWN.   Do not use on face/ open  Wound or open sores. Avoid contact with eyes, ears mouth and genitals (private parts).                       Wash face,  Genitals (private parts) with your normal soap.             6.  Wash thoroughly, paying special attention to the area where your surgery  will be performed.  7.  Thoroughly rinse your body with warm water from the neck down.  8.  DO NOT shower/wash with your normal soap after using and rinsing off  the CHG Soap.                9.  Pat yourself dry with a clean towel.            10.  Wear clean pajamas.            11.  Place clean sheets on your bed the night of your first shower and do not  sleep with pets. Day of Surgery : Do not apply any lotions/deodorants the morning of surgery.  Please wear clean clothes to the hospital/surgery center.  FAILURE TO FOLLOW THESE INSTRUCTIONS MAY RESULT IN THE CANCELLATION OF YOUR SURGERY PATIENT SIGNATURE_________________________________  NURSE SIGNATURE__________________________________  ________________________________________________________________________

## 2015-10-07 NOTE — Progress Notes (Signed)
Faxed blood refusal consent to Dr Gaynelle Arabian and also to blood bank. Also LVMM with Pam scheduler with Dr Gaynelle Arabian in regards to blood refusal consent.

## 2015-10-07 NOTE — Progress Notes (Signed)
BMP results / epic per PAT visit 10/07/2015 sent to Dr Gaynelle Arabian

## 2015-10-08 LAB — HEMOGLOBIN A1C
Hgb A1c MFr Bld: 7 % — ABNORMAL HIGH (ref 4.8–5.6)
Mean Plasma Glucose: 154 mg/dL

## 2015-10-10 NOTE — Progress Notes (Signed)
EKG/epic per PAT visit 10/07/2015 reviewed per anesthesia/Dr Carignan. No orders given. Anesthesia to see pt day of surgery.  

## 2015-10-11 DIAGNOSIS — N138 Other obstructive and reflux uropathy: Secondary | ICD-10-CM | POA: Diagnosis not present

## 2015-10-11 DIAGNOSIS — N401 Enlarged prostate with lower urinary tract symptoms: Secondary | ICD-10-CM | POA: Diagnosis not present

## 2015-10-11 DIAGNOSIS — M6281 Muscle weakness (generalized): Secondary | ICD-10-CM | POA: Diagnosis not present

## 2015-10-14 ENCOUNTER — Encounter (HOSPITAL_COMMUNITY): Payer: Self-pay | Admitting: *Deleted

## 2015-10-14 ENCOUNTER — Inpatient Hospital Stay (HOSPITAL_COMMUNITY): Payer: PPO | Admitting: Anesthesiology

## 2015-10-14 ENCOUNTER — Encounter (HOSPITAL_COMMUNITY)
Admission: RE | Disposition: A | Discharge status: Home / Self Care | Payer: Self-pay | Source: Ambulatory Visit | Attending: Urology

## 2015-10-14 ENCOUNTER — Inpatient Hospital Stay (HOSPITAL_COMMUNITY)
Admission: RE | Admit: 2015-10-14 | Discharge: 2015-10-16 | DRG: 708 | Disposition: A | Discharge status: Home / Self Care | Payer: PPO | Source: Ambulatory Visit | Attending: Urology | Admitting: Urology

## 2015-10-14 DIAGNOSIS — Z7982 Long term (current) use of aspirin: Secondary | ICD-10-CM

## 2015-10-14 DIAGNOSIS — Z7984 Long term (current) use of oral hypoglycemic drugs: Secondary | ICD-10-CM | POA: Diagnosis not present

## 2015-10-14 DIAGNOSIS — N138 Other obstructive and reflux uropathy: Secondary | ICD-10-CM | POA: Diagnosis not present

## 2015-10-14 DIAGNOSIS — Z01810 Encounter for preprocedural cardiovascular examination: Secondary | ICD-10-CM | POA: Diagnosis not present

## 2015-10-14 DIAGNOSIS — Z7951 Long term (current) use of inhaled steroids: Secondary | ICD-10-CM | POA: Diagnosis not present

## 2015-10-14 DIAGNOSIS — R350 Frequency of micturition: Secondary | ICD-10-CM | POA: Diagnosis not present

## 2015-10-14 DIAGNOSIS — E119 Type 2 diabetes mellitus without complications: Secondary | ICD-10-CM | POA: Diagnosis not present

## 2015-10-14 DIAGNOSIS — Z01812 Encounter for preprocedural laboratory examination: Secondary | ICD-10-CM

## 2015-10-14 DIAGNOSIS — E7800 Pure hypercholesterolemia, unspecified: Secondary | ICD-10-CM | POA: Diagnosis not present

## 2015-10-14 DIAGNOSIS — J45909 Unspecified asthma, uncomplicated: Secondary | ICD-10-CM | POA: Diagnosis present

## 2015-10-14 DIAGNOSIS — Z8249 Family history of ischemic heart disease and other diseases of the circulatory system: Secondary | ICD-10-CM | POA: Diagnosis not present

## 2015-10-14 DIAGNOSIS — Z79899 Other long term (current) drug therapy: Secondary | ICD-10-CM | POA: Diagnosis not present

## 2015-10-14 DIAGNOSIS — N401 Enlarged prostate with lower urinary tract symptoms: Secondary | ICD-10-CM | POA: Diagnosis not present

## 2015-10-14 DIAGNOSIS — R351 Nocturia: Secondary | ICD-10-CM | POA: Diagnosis present

## 2015-10-14 DIAGNOSIS — N400 Enlarged prostate without lower urinary tract symptoms: Secondary | ICD-10-CM | POA: Diagnosis present

## 2015-10-14 DIAGNOSIS — K219 Gastro-esophageal reflux disease without esophagitis: Secondary | ICD-10-CM | POA: Diagnosis not present

## 2015-10-14 HISTORY — PX: PROSTATECTOMY: SHX69

## 2015-10-14 LAB — GLUCOSE, CAPILLARY
Glucose-Capillary: 111 mg/dL — ABNORMAL HIGH (ref 65–99)
Glucose-Capillary: 124 mg/dL — ABNORMAL HIGH (ref 65–99)

## 2015-10-14 LAB — HEMOGLOBIN AND HEMATOCRIT, BLOOD
HCT: 34.8 % — ABNORMAL LOW (ref 39.0–52.0)
Hemoglobin: 11.6 g/dL — ABNORMAL LOW (ref 13.0–17.0)

## 2015-10-14 SURGERY — PROSTATECTOMY, SUPRAPUBIC APPROACH
Anesthesia: Spinal

## 2015-10-14 MED ORDER — ACETAMINOPHEN 500 MG PO TABS
1000.0000 mg | ORAL_TABLET | Freq: Four times a day (QID) | ORAL | Status: DC | PRN
  Administered 2015-10-14: 1000 mg via ORAL
  Filled 2015-10-14: qty 2

## 2015-10-14 MED ORDER — PHENYLEPHRINE 40 MCG/ML (10ML) SYRINGE FOR IV PUSH (FOR BLOOD PRESSURE SUPPORT)
PREFILLED_SYRINGE | INTRAVENOUS | Status: AC
  Filled 2015-10-14: qty 10

## 2015-10-14 MED ORDER — MIDAZOLAM HCL 5 MG/5ML IJ SOLN
INTRAMUSCULAR | Status: DC | PRN
  Administered 2015-10-14: 2 mg via INTRAVENOUS

## 2015-10-14 MED ORDER — DEXTROSE-NACL 5-0.45 % IV SOLN
INTRAVENOUS | Status: DC
  Administered 2015-10-14: 100 mL/h via INTRAVENOUS
  Administered 2015-10-15: 01:00:00 via INTRAVENOUS

## 2015-10-14 MED ORDER — SODIUM CHLORIDE 0.9 % IJ SOLN
INTRAMUSCULAR | Status: DC | PRN
  Administered 2015-10-14: 20 mL

## 2015-10-14 MED ORDER — FENTANYL CITRATE (PF) 100 MCG/2ML IJ SOLN
INTRAMUSCULAR | Status: DC | PRN
  Administered 2015-10-14: 50 ug via INTRAVENOUS

## 2015-10-14 MED ORDER — SODIUM CHLORIDE 0.9 % IR SOLN
3000.0000 mL | Status: DC
  Administered 2015-10-14 – 2015-10-15 (×5): 3000 mL

## 2015-10-14 MED ORDER — MORPHINE SULFATE (PF) 2 MG/ML IV SOLN
2.0000 mg | INTRAVENOUS | Status: DC | PRN
  Administered 2015-10-14: 4 mg via INTRAVENOUS
  Administered 2015-10-14 – 2015-10-15 (×3): 2 mg via INTRAVENOUS
  Filled 2015-10-14 (×2): qty 1
  Filled 2015-10-14: qty 2
  Filled 2015-10-14: qty 1

## 2015-10-14 MED ORDER — ONDANSETRON HCL 4 MG/2ML IJ SOLN: 4.0000 mg | Freq: Once | INTRAMUSCULAR | Status: DC

## 2015-10-14 MED ORDER — PROPOFOL 10 MG/ML IV BOLUS
INTRAVENOUS | Status: AC
  Filled 2015-10-14: qty 20

## 2015-10-14 MED ORDER — CEFAZOLIN SODIUM 1-5 GM-% IV SOLN
1.0000 g | Freq: Three times a day (TID) | INTRAVENOUS | Status: AC
  Administered 2015-10-14 – 2015-10-15 (×2): 1 g via INTRAVENOUS
  Filled 2015-10-14 (×3): qty 50

## 2015-10-14 MED ORDER — SODIUM CHLORIDE 0.9 % IV BOLUS (SEPSIS)
1000.0000 mL | Freq: Once | INTRAVENOUS | Status: AC
  Administered 2015-10-14: 1000 mL via INTRAVENOUS

## 2015-10-14 MED ORDER — BUPIVACAINE LIPOSOME 1.3 % IJ SUSP
20.0000 mL | Freq: Once | INTRAMUSCULAR | Status: DC
  Filled 2015-10-14: qty 20

## 2015-10-14 MED ORDER — CEFAZOLIN SODIUM-DEXTROSE 2-4 GM/100ML-% IV SOLN
INTRAVENOUS | Status: AC
  Filled 2015-10-14: qty 100

## 2015-10-14 MED ORDER — BUPIVACAINE LIPOSOME 1.3 % IJ SUSP
INTRAMUSCULAR | Status: DC | PRN
  Administered 2015-10-14: 20 mL

## 2015-10-14 MED ORDER — SODIUM CHLORIDE 0.9 % IR SOLN
Status: DC | PRN
  Administered 2015-10-14: 2000 mL

## 2015-10-14 MED ORDER — DEXAMETHASONE SODIUM PHOSPHATE 10 MG/ML IJ SOLN
INTRAMUSCULAR | Status: AC
  Filled 2015-10-14: qty 1

## 2015-10-14 MED ORDER — FENTANYL CITRATE (PF) 100 MCG/2ML IJ SOLN
25.0000 ug | INTRAMUSCULAR | Status: DC | PRN
  Administered 2015-10-14 (×2): 25 ug via INTRAVENOUS
  Administered 2015-10-14: 50 ug via INTRAVENOUS

## 2015-10-14 MED ORDER — PROPOFOL 10 MG/ML IV BOLUS
INTRAVENOUS | Status: DC | PRN
  Administered 2015-10-14: 30 mg via INTRAVENOUS

## 2015-10-14 MED ORDER — OXYCODONE-ACETAMINOPHEN 5-325 MG PO TABS
1.0000 | ORAL_TABLET | ORAL | Status: DC | PRN
  Administered 2015-10-14 – 2015-10-16 (×2): 2 via ORAL
  Administered 2015-10-16 (×2): 1 via ORAL
  Filled 2015-10-14 (×2): qty 2
  Filled 2015-10-14: qty 1
  Filled 2015-10-14: qty 2

## 2015-10-14 MED ORDER — SENNA 8.6 MG PO TABS
1.0000 | ORAL_TABLET | Freq: Two times a day (BID) | ORAL | Status: DC
  Administered 2015-10-14 – 2015-10-16 (×4): 8.6 mg via ORAL
  Filled 2015-10-14 (×4): qty 1

## 2015-10-14 MED ORDER — MIDAZOLAM HCL 2 MG/2ML IJ SOLN
INTRAMUSCULAR | Status: AC
  Filled 2015-10-14: qty 2

## 2015-10-14 MED ORDER — SODIUM CHLORIDE 0.9 % IJ SOLN
INTRAMUSCULAR | Status: AC
  Filled 2015-10-14: qty 50

## 2015-10-14 MED ORDER — PROPOFOL 500 MG/50ML IV EMUL
INTRAVENOUS | Status: DC | PRN
  Administered 2015-10-14: 100 ug/kg/min via INTRAVENOUS

## 2015-10-14 MED ORDER — MEPERIDINE HCL 50 MG/ML IJ SOLN: 6.2500 mg | INTRAMUSCULAR | Status: DC | PRN

## 2015-10-14 MED ORDER — CEFAZOLIN SODIUM-DEXTROSE 2-4 GM/100ML-% IV SOLN
2.0000 g | INTRAVENOUS | Status: AC
  Administered 2015-10-14: 2 g via INTRAVENOUS
  Filled 2015-10-14: qty 100

## 2015-10-14 MED ORDER — FENTANYL CITRATE (PF) 100 MCG/2ML IJ SOLN
INTRAMUSCULAR | Status: AC
  Filled 2015-10-14: qty 2

## 2015-10-14 MED ORDER — BUPIVACAINE HCL (PF) 0.5 % IJ SOLN
INTRAMUSCULAR | Status: DC | PRN
  Administered 2015-10-14: .5 mL

## 2015-10-14 MED ORDER — FLUORESCEIN SODIUM 10 % IJ SOLN
500.0000 mg | Freq: Once | INTRAMUSCULAR | Status: DC
  Filled 2015-10-14: qty 5

## 2015-10-14 MED ORDER — BUPIVACAINE IN DEXTROSE 0.75-8.25 % IT SOLN
INTRATHECAL | Status: DC | PRN
  Administered 2015-10-14: 2 mL via INTRATHECAL

## 2015-10-14 MED ORDER — EPHEDRINE SULFATE 50 MG/ML IJ SOLN
INTRAMUSCULAR | Status: DC | PRN
  Administered 2015-10-14 (×3): 10 mg via INTRAVENOUS

## 2015-10-14 MED ORDER — LACTATED RINGERS IV SOLN
INTRAVENOUS | Status: DC | PRN
  Administered 2015-10-14 (×3): via INTRAVENOUS

## 2015-10-14 MED ORDER — ONDANSETRON HCL 4 MG/2ML IJ SOLN
INTRAMUSCULAR | Status: AC
  Filled 2015-10-14: qty 2

## 2015-10-14 MED ORDER — DOCUSATE SODIUM 100 MG PO CAPS
100.0000 mg | ORAL_CAPSULE | Freq: Two times a day (BID) | ORAL | Status: DC
  Administered 2015-10-14 – 2015-10-15 (×2): 100 mg via ORAL
  Filled 2015-10-14 (×2): qty 1

## 2015-10-14 MED ORDER — PHENYLEPHRINE HCL 10 MG/ML IJ SOLN
INTRAMUSCULAR | Status: DC | PRN
  Administered 2015-10-14 (×12): 80 ug via INTRAVENOUS

## 2015-10-14 MED ORDER — MOMETASONE FURO-FORMOTEROL FUM 200-5 MCG/ACT IN AERO
2.0000 | INHALATION_SPRAY | Freq: Two times a day (BID) | RESPIRATORY_TRACT | Status: DC
  Administered 2015-10-14 – 2015-10-15 (×3): 2 via RESPIRATORY_TRACT
  Filled 2015-10-14: qty 8.8

## 2015-10-14 MED ORDER — LIDOCAINE HCL (CARDIAC) 20 MG/ML IV SOLN
INTRAVENOUS | Status: AC
  Filled 2015-10-14: qty 5

## 2015-10-14 MED ORDER — ONDANSETRON HCL 4 MG/2ML IJ SOLN
INTRAMUSCULAR | Status: DC | PRN
Start: 2015-10-14 — End: 2015-10-14
  Administered 2015-10-14: 4 mg via INTRAVENOUS

## 2015-10-14 MED ORDER — LACTATED RINGERS IV SOLN
INTRAVENOUS | Status: DC
  Administered 2015-10-14: 13:00:00 via INTRAVENOUS

## 2015-10-14 SURGICAL SUPPLY — 48 items
AGENT HMST MTR 8 SURGIFLO (HEMOSTASIS)
APL SRG 38 LTWT LNG FL B (MISCELLANEOUS) ×1
APPLICATOR ARISTA FLEXITIP XL (MISCELLANEOUS) ×2 IMPLANT
BAG URINE DRAINAGE (UROLOGICAL SUPPLIES) ×3 IMPLANT
BLADE EXTENDED COATED 6.5IN (ELECTRODE) ×3 IMPLANT
BLADE HEX COATED 2.75 (ELECTRODE) ×3 IMPLANT
BLADE SURG SZ12 CARB STEEL (BLADE) ×3 IMPLANT
CATH FOLEY 2WAY SLVR 30CC 22FR (CATHETERS) IMPLANT
CATH HEMA 3WAY 30CC 22FR COUDE (CATHETERS) ×2 IMPLANT
CLIP LIGATING HEM O LOK PURPLE (MISCELLANEOUS) ×6 IMPLANT
COVER SURGICAL LIGHT HANDLE (MISCELLANEOUS) ×3 IMPLANT
DRAIN CHANNEL 10F 3/8 F FF (DRAIN) IMPLANT
DRAPE LAPAROTOMY T 102X78X121 (DRAPES) ×3 IMPLANT
DRAPE UTILITY 15X26 (DRAPE) ×3 IMPLANT
DRAPE WARM FLUID 44X44 (DRAPE) ×3 IMPLANT
DRSG OPSITE POSTOP 4X8 (GAUZE/BANDAGES/DRESSINGS) ×2 IMPLANT
ELECT REM PT RETURN 9FT ADLT (ELECTROSURGICAL) ×3
ELECTRODE REM PT RTRN 9FT ADLT (ELECTROSURGICAL) ×1 IMPLANT
EVACUATOR SILICONE 100CC (DRAIN) IMPLANT
GAUZE SPONGE 4X4 12PLY STRL (GAUZE/BANDAGES/DRESSINGS) ×2 IMPLANT
GAUZE SPONGE 4X4 16PLY XRAY LF (GAUZE/BANDAGES/DRESSINGS) ×3 IMPLANT
GLOVE BIOGEL M STRL SZ7.5 (GLOVE) ×3 IMPLANT
GOWN STRL REUS W/TWL XL LVL3 (GOWN DISPOSABLE) ×3 IMPLANT
HEMOSTAT ARISTA ABSORB 3G PWDR (MISCELLANEOUS) ×2 IMPLANT
KIT BASIN OR (CUSTOM PROCEDURE TRAY) ×3 IMPLANT
NEEDLE HYPO 22GX1.5 SAFETY (NEEDLE) ×3 IMPLANT
NS IRRIG 1000ML POUR BTL (IV SOLUTION) ×6 IMPLANT
PACK GENERAL/GYN (CUSTOM PROCEDURE TRAY) ×3 IMPLANT
PLUG CATH AND CAP STER (CATHETERS) ×4 IMPLANT
SCRUB PCMX 4 OZ (MISCELLANEOUS) ×3 IMPLANT
SPOGE SURGIFLO 8M (HEMOSTASIS)
SPONGE LAP 18X18 X RAY DECT (DISPOSABLE) ×3 IMPLANT
SPONGE LAP 4X18 X RAY DECT (DISPOSABLE) ×3 IMPLANT
SPONGE SURGIFLO 8M (HEMOSTASIS) IMPLANT
SURGIFLO W/THROMBIN 8M KIT (HEMOSTASIS) ×3 IMPLANT
SURGILUBE 3G PEEL PACK STRL (MISCELLANEOUS) ×12 IMPLANT
SUT ETHILON 3 0 PS 1 (SUTURE) ×2 IMPLANT
SUT PDS AB 1 TP1 96 (SUTURE) ×4 IMPLANT
SUT VIC AB 0 UR5 27 (SUTURE) ×2 IMPLANT
SUT VIC AB 2-0 UR5 27 (SUTURE) ×8 IMPLANT
SUT VIC AB 2-0 UR6 27 (SUTURE) ×6 IMPLANT
SUT VIC AB 3-0 SH 27 (SUTURE) ×3
SUT VIC AB 3-0 SH 27XBRD (SUTURE) IMPLANT
SYR 30ML LL (SYRINGE) ×3 IMPLANT
SYR CONTROL 10ML LL (SYRINGE) ×3 IMPLANT
TOWEL OR 17X26 10 PK STRL BLUE (TOWEL DISPOSABLE) ×6 IMPLANT
TOWEL OR NON WOVEN STRL DISP B (DISPOSABLE) ×3 IMPLANT
WATER STERILE IRR 1500ML POUR (IV SOLUTION) ×3 IMPLANT

## 2015-10-14 NOTE — Progress Notes (Signed)
Hgb. And Hct. Drawn by lab. 

## 2015-10-14 NOTE — Anesthesia Preprocedure Evaluation (Addendum)
Anesthesia Evaluation  Patient identified by MRN, date of birth, ID band Patient awake and Patient confused    Reviewed: Allergy & Precautions, H&P , NPO status , Patient's Chart, lab work & pertinent test results  Airway Mallampati: II  TM Distance: >3 FB Neck ROM: full    Dental no notable dental hx. (+) Caps Prominent upper caps; DI alert:   Pulmonary asthma , COPD,    Pulmonary exam normal breath sounds clear to auscultation       Cardiovascular Exercise Tolerance: Good negative cardio ROS   Rhythm:regular Rate:Normal  EKG  Possible old septal MI   Neuro/Psych    GI/Hepatic Neg liver ROS, GERD  Medicated,  Endo/Other  diabetes, Poorly Controlled, Type 2  Renal/GU negative Renal ROS   Enlarged prostate    Musculoskeletal   Abdominal   Peds  Hematology  (+) REFUSES BLOOD PRODUCTS, 15/45   Anesthesia Other Findings   Reproductive/Obstetrics                            Anesthesia Physical Anesthesia Plan  ASA: II  Anesthesia Plan: Spinal   Post-op Pain Management:    Induction: Intravenous  Airway Management Planned: Natural Airway  Additional Equipment:   Intra-op Plan:   Post-operative Plan: Extubation in OR  Informed Consent: I have reviewed the patients History and Physical, chart, labs and discussed the procedure including the risks, benefits and alternatives for the proposed anesthesia with the patient or authorized representative who has indicated his/her understanding and acceptance.     Plan Discussed with:   Anesthesia Plan Comments: (2nd IV be nice)       Anesthesia Quick Evaluation

## 2015-10-14 NOTE — Progress Notes (Signed)
10 cc Sterile H2O  Inserted into Foley catheter balloon- Foley to traction-per Dr. Marylyn Ishihara Jackson's orders for Dr. Carlota Raspberry- Dr. Glennon Mac had talked with Dr. Carlota Raspberry on telephone

## 2015-10-14 NOTE — Progress Notes (Signed)
O.K. To go to floor -per Dr. Maudie Flakes

## 2015-10-14 NOTE — Anesthesia Procedure Notes (Signed)
Spinal Patient location during procedure: OR Start time: 10/14/2015 9:45 AM End time: 10/14/2015 9:46 AM Reason for block: at surgeon's request Staffing Resident/CRNA: Freddie Breech Performed by: resident/CRNA  Preanesthetic Checklist Completed: patient identified, site marked, surgical consent, pre-op evaluation, timeout performed, IV checked, risks and benefits discussed, monitors and equipment checked and at surgeon's request Spinal Block Patient position: sitting Prep: ChloraPrep Approach: midline Location: L2-3 Injection technique: single-shot Needle Needle type: Sprotte  Needle gauge: 24 G Needle length: 10 cm Assessment Sensory level: T6 Additional Notes Expiration date of kit checked and confirmed. Patient tolerated procedure well, without complications. X 1 attempt with noted clear CSF return. Loss of motor and sensory on exam post injection.

## 2015-10-14 NOTE — Progress Notes (Signed)
Dr. Carlota Raspberry notified of patient's Hgb. 11.6- Hct. 34.8- blood pressures- urine grossly bloody in appearance- instructed to call Dr. Lyndle Herrlich- - he will call Dr. Carlota Raspberry

## 2015-10-14 NOTE — H&P (Signed)
Reason For Visit Cystoscopy, flowrate, PVR & PUS   Active Problems Problems  1. Benign localized hyperplasia of prostate with urinary obstruction (N40.1,N13.8) 2. Elevated prostate specific antigen (PSA) (R97.20) 3. Nocturia (R35.1) 4. Urinary frequency (R35.0)  History of Present Illness    80 yo Type II diabetic male returns today for a cystoscopy, flowrate, PVR & prostate u/s. Referred back by Dr. Luan Pulling for further evaluation of an elevated PSA of 11.72 on 05/04/15. IPSS has increased to 14 ( N=7). MRI shows multiple areas of BPH, and possibly low grade CaP- but nothing suspicious for high grade CaP. His current IPSS= 14.     Previous hx of elevated PSA's and PSA was 5.5 and he underwent a biopsy in January 2009 which was negative. His PSA on 07/22/08 was 5.26, and a followup PSA on 02/21/09 was 5.83.   Past Medical History Problems  1. History of Asthma (J45.909) 2. History of Benign prostatic hypertrophy without lower urinary tract symptoms (N40.0) 3. History of diabetes mellitus (Z86.39) 4. History of esophageal reflux (Z87.19) 5. History of hypercholesterolemia (Z86.39) 6. History of Nodular prostate without lower urinary tract symptoms (N40.2)  Surgical History Problems  1. History of Back Surgery 2. History of Nose Surgery  Current Meds 1. MetFORMIN HCl TABS;  Therapy: (Recorded:15Jan2009) to Recorded 2. Omeprazole 20 MG Oral Capsule Delayed Release;  Therapy: (Recorded:25Jan2017) to Recorded 3. Symbicort AERO;  Therapy: (Recorded:25Jan2017) to Recorded  Allergies Medication  1. Aspirin TABS  Family History Problems  1. Family history of Death In The Family Father : Father   62-heart attack 2. Family history of Death In The Family Mother : Father   43-unknown 3. Family history of cardiac disorder (Z82.49) : Father 4. Family history of Heart Disease : Father  Social History Problems  1. Denied: History of Alcohol Use (History) 2. Denied: History of  Caffeine Use 3. Marital History - Single 4. Never smoker 5. Occupation:   farmer 6. Denied: History of Tobacco Use  Review of Systems  Genitourinary: urinary frequency, feelings of urinary urgency, nocturia, difficulty starting the urinary stream, weak urinary stream, urinary stream starts and stops, incomplete emptying of bladder, decreased libido, erectile dysfunction and initiating urination requires straining, but no dysuria, no incontinence, no post-void dribbling, no hematuria, no testicular pain, no testicular mass, no scrotal pain, no scrotal swelling, no scrotal mass and no scrotal lesion.    Vitals Vital Signs [Data Includes: Last 1 Day]  Recorded: 05Apr2017 02:50PM  Blood Pressure: 142 / 82 Temperature: 97.2 F Heart Rate: 71  Physical Exam Constitutional: Well nourished and well developed . No acute distress.  ENT:. The ears and nose are normal in appearance.  Neck: The appearance of the neck is normal and no neck mass is present.  Cardiovascular: Heart rate and rhythm are normal . No peripheral edema.  Abdomen: The abdomen is flat. No hernias are palpable.  Rectal: Rectal exam demonstrates normal sphincter tone, the anus is normal on inspection. and no residual hemorrhoidal skin tags seen. Estimated prostate size is 4+. Normal rectal tone, no rectal masses, prostate is smooth, symmetric and non-tender. The prostate has no nodularity, is not indurated, is not tender and is not fluctuant. The left seminal vesicle is nonpalpable, not tender and without masses. The right seminal vesicle is nonpalpable, without masses and not tender. The perineum is normal on inspection.  Genitourinary: Examination of the penis demonstrates no discharge, no masses, no lesions and a normal meatus. The scrotum is normal in appearance  and without lesions. The right epididymis is palpably normal and non-tender. The left epididymis is palpably normal and non-tender. The right testis is palpably normal,  non-tender and without masses. The left testis is normal, non-tender and without masses.  Lymphatics: The femoral and inguinal nodes are not enlarged or tender.  Skin: Normal skin turgor, no visible rash and no visible skin lesions.  Neuro/Psych:. Mood and affect are appropriate.    Results/Data Urine [Data Includes: Last 1 Day]   05Apr2017  COLOR YELLOW   APPEARANCE CLEAR   SPECIFIC GRAVITY 1.020   pH 6.5   GLUCOSE NEGATIVE   BILIRUBIN NEGATIVE   KETONE NEGATIVE   BLOOD NEGATIVE   PROTEIN NEGATIVE   NITRITE NEGATIVE   LEUKOCYTE ESTERASE 1+   SQUAMOUS EPITHELIAL/HPF 0-5 HPF  WBC 6-10 WBC/HPF  RBC 0-2 RBC/HPF  BACTERIA NONE SEEN HPF  CRYSTALS NONE SEEN HPF  CASTS NONE SEEN LPF  Other    Yeast NONE SEEN HPF   Flow Rate: Voided 79 ml. A peak flow rate of 103ml/s and mean flow rate of 31ml/s.  PVR: Ultrasound PVR 180/.85 ml.    Procedure Prostate u/s today: Length - 7.88, Height - 6.29, and Width - 7.12. Total volume - 184.72 grams. Appears inhomogeneous cystic area noted - 4.57mm. Calcifications noted.   Procedure: Cystoscopy  Chaperone Present: klim Lewis.  Indication: Lower Urinary Tract Symptoms.  Informed Consent: Risks, benefits, and potential adverse events were discussed and informed consent was obtained from the patient.  Prep: The patient was prepped with betadine.  Anesthesia:. Local anesthesia was administered intraurethrally with 2% lidocaine jelly.  Antibiotic prophylaxis: Ciprofloxacin.  Procedure Note:  Urethral meatus:. No abnormalities.  Anterior urethra: No abnormalities.  Prostatic urethra: No abnormalities . The lateral and median prostatic lobes were enlarged. An enlarged intravesical median lobe was visualized.  Bladder: Visulization was clear. The ureteral orifices were in the normal anatomic position bilaterally and had clear efflux of urine. A systematic survey of the bladder demonstrated no bladder tumors or stones. Examination of the bladder  demonstrated severe, grade 3 trabeculation, but no clot within the bladder cellules, but no erythematous mucosa, no ulcer and no edema. The patient tolerated the procedure well.  Complications: None.    Assessment Assessed  1. Benign localized hyperplasia of prostate with urinary obstruction (N40.1,N13.8) 2. Elevated prostate specific antigen (PSA) (R97.20) 3. Nocturia (R35.1) 4. Urinary frequency (R35.0) 5. History of diabetes mellitus (Z86.39)  80 yo male, Type II diabetic with IPSS= 14, Flow rate max 2cc/sec, mean 2cc/sec , PUS=185cc, with cysto showing large intravesical lobe, and trabeculation and cellule formation. He will have pre-op Physical Therapy, then open trans-vesical prostatectomy, followed by cystogram at 10 days post op, and PT post op. 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   Plan Benign localized hyperplasia of prostate with urinary obstruction  1. PT/OT Referral Referral  Referral  Status: Hold For - PreCert,Date of Service,Physical  Therapy  Requested for: 06Apr2017 Benign localized hyperplasia of prostate with urinary obstruction, Nocturia, Urinary frequency  2. PVR U/S; Status:Hold For - Date of Service; Requested for:05Apr2017;  Health Maintenance  3. UA With REFLEX; [Do Not Release]; Status:Resulted - Requires Verification;   DoneWG:[id number] 01:31PM  Open transvesical prostatectomy.   Discussion/Summary cc: Sinda Du, MD     Signatures Electronically signed by : Carolan Clines, M.D.; Oct 05 2015  5:16PM EST

## 2015-10-14 NOTE — Anesthesia Postprocedure Evaluation (Signed)
Anesthesia Post Note  Patient: Damon Navarro  Procedure(s) Performed: Procedure(s) (LRB): PROSTATECTOMY TRANSVESICAL (N/A)  Patient location during evaluation: PACU Anesthesia Type: Spinal Level of consciousness: awake Pain management: satisfactory to patient Vital Signs Assessment: post-procedure vital signs reviewed and stable Respiratory status: spontaneous breathing Cardiovascular status: blood pressure returned to baseline Postop Assessment: no headache and spinal receding Anesthetic complications: no Comments: Urine more clear, VSS off pressors    Last Vitals:  Filed Vitals:   10/14/15 1435 10/14/15 1440  BP: 113/66 120/66  Pulse: 67 66  Temp:    Resp: 20 20    Last Pain:  Filed Vitals:   10/14/15 1443  PainSc: 5                  Stormi Vandevelde EDWARD

## 2015-10-14 NOTE — Op Note (Signed)
Post-operative Diagnosis: BPH  Procedure and Anesthesia:  Procedure(s) and Anesthesia Type:    * PROSTATECTOMY SUPRAPUBIC - General  Surgeon: Surgeon(s) and Role:    * Carolan Clines, MD - Primary   Resident:  Christell Faith, MD  EBL: 300 ml  IVF: See anesthesia record  Drains: JP drain in LLQ  Specimens: Prostate Adenoma  Complications: none  Indications for Surgery: 80 y.o. male with BPH. Risks, benefits, and alternatives of the above procedure were discussed previously in detail and informed consents was signed and verified.  Procedure Details:   The patient and consent were verified in the pre-op holding area and brought to the operating room where they were placed on the operating table. Pre-induction time out was called and general anesthesia was induced. SCDs were placed and IV antibiotics were started. The patient was positioned with an axillary roll behind his hips and the table was flexed. He was placed in Trendelenberg's position to adequately expose the pelvis. He was clipped, prepped and draped in the usual sterile fashion using betadine.  A 20 Fr foley catheter was placed on the sterile field in standard fashion. After a pre-incision time-out was performed we made a roughly 7 cm low midline incision from pubis up cephalad a hands breadth below the umbilicus in the midline. We carefully dissected through the subcutaneous tissues using electrocautery. We identified the linea alba in the midline and came through exposing the belly of the rectus muscle which was split in a muscle sparing fashion. The anterior and lateral attachments of the bladder were taken down using a combination of blunt dissection, sharp dissection and electrocautery. Once we identified the bladder neck and prostate we placed our retractor and pulled the bladder cephalad which clearly exposed the bladder neck and the prostate. We then placed a stay suture with 2-0 vicryl to allow closure of the cystotomy.  The foley balloon was palpated as were the borders of the prostate.  We released traction on the bladder and 2-0 Vicryl stay sutures were placed in the bladder at the anterior mid bladder and a vertical incision was made through a very thick walled detrusor and into the bladder where the foley catheter balloon was identified. There was a significant median lobe component as noted cystoscopically. We identified the ureteral orifices bilaterally which were away from the median lobe. We scored our resection plan with Bovie electrocautery and then, using Metzenbaum scissors, identified the ideal plan between the adenoma and prostate capsule and proceeded to finger fracture the adenoma taking care not to disrupt the bladder neck. The adenoma was freed from all capsular attachments and was removed en bloc lobe. We then re-approximated the prostate capsule to the bladder neck using 3-0 Vicryl suture in mixed interrupted and running fashion. Arista was then placed along the area of the anastomosis for enhanced hemostasis. The foley balloon was then placed on traction and the bladder was packed with laps for 5 minutes to further enhance hemostasis. Hemostasis was noted to be adequate. We exchanged the 20 Fr foley over a wire for a 22 Fr 3-way hematuria catheter. The catheter was placed on traction. We then proceeded to close the bladder in two layers (one mucosal layer using the previously placed 2-0 Vicryl and one imbricating detrusor layer using 3-0 Vicryl).   The closure was water tight on irrigation. A 19 Fr Blake drain was placed in the pelvis and brought out lateral to the rectus muscles in the left lower quadrant.  We then turned  our attention to closing. We loosely re-approximated the rectus muscls with #2-0 Vicryl suture. The rectus fascia was then closed with two separate running 1- looped PDS suture in standard fashion. The skin was closed with staples. Exparel was injected in the deep tissue layers prior to  closing. Waffle dressing was applied. The catheter was placed on traction and was irrigated. Draining urine was thin, cherry colored and without clot and so CBI was started. At this point the procedure was complete, anesthesia was reversed and the patient awoke having tolerated the procedure well. He was taken to the PACU in stable condition.

## 2015-10-14 NOTE — Progress Notes (Signed)
Dr. Lyndle Herrlich in - made aware of patient's vital signs- urine grossly bloody- orders given.-

## 2015-10-14 NOTE — Transfer of Care (Signed)
Immediate Anesthesia Transfer of Care Note  Patient: Damon Navarro  Procedure(s) Performed: Procedure(s): PROSTATECTOMY TRANSVESICAL (N/A)  Patient Location: PACU  Anesthesia Type:Spinal  Level of Consciousness:  sedated, patient cooperative and responds to stimulation  Airway & Oxygen Therapy:Patient Spontanous Breathing and Patient connected to face mask oxgen  Post-op Assessment:  Report given to PACU RN and Post -op Vital signs reviewed and stable  Post vital signs:  Reviewed and stable  Last Vitals:  Filed Vitals:   10/14/15 0749  BP: 161/82  Pulse: 65  Temp: 36.3 C  Resp: 18    Complications: No apparent anesthesia complications

## 2015-10-14 NOTE — Progress Notes (Signed)
Dr. Lyndle Herrlich in to see patient.

## 2015-10-15 DIAGNOSIS — J45909 Unspecified asthma, uncomplicated: Secondary | ICD-10-CM | POA: Diagnosis not present

## 2015-10-15 DIAGNOSIS — Z01810 Encounter for preprocedural cardiovascular examination: Secondary | ICD-10-CM | POA: Diagnosis not present

## 2015-10-15 DIAGNOSIS — N400 Enlarged prostate without lower urinary tract symptoms: Secondary | ICD-10-CM | POA: Diagnosis not present

## 2015-10-15 DIAGNOSIS — Z8249 Family history of ischemic heart disease and other diseases of the circulatory system: Secondary | ICD-10-CM | POA: Diagnosis not present

## 2015-10-15 DIAGNOSIS — Z79899 Other long term (current) drug therapy: Secondary | ICD-10-CM | POA: Diagnosis not present

## 2015-10-15 DIAGNOSIS — Z01812 Encounter for preprocedural laboratory examination: Secondary | ICD-10-CM | POA: Diagnosis not present

## 2015-10-15 DIAGNOSIS — R350 Frequency of micturition: Secondary | ICD-10-CM | POA: Diagnosis not present

## 2015-10-15 DIAGNOSIS — K219 Gastro-esophageal reflux disease without esophagitis: Secondary | ICD-10-CM | POA: Diagnosis not present

## 2015-10-15 DIAGNOSIS — E7800 Pure hypercholesterolemia, unspecified: Secondary | ICD-10-CM | POA: Diagnosis not present

## 2015-10-15 DIAGNOSIS — Z7984 Long term (current) use of oral hypoglycemic drugs: Secondary | ICD-10-CM | POA: Diagnosis not present

## 2015-10-15 DIAGNOSIS — E119 Type 2 diabetes mellitus without complications: Secondary | ICD-10-CM | POA: Diagnosis not present

## 2015-10-15 DIAGNOSIS — R351 Nocturia: Secondary | ICD-10-CM | POA: Diagnosis not present

## 2015-10-15 DIAGNOSIS — N138 Other obstructive and reflux uropathy: Secondary | ICD-10-CM | POA: Diagnosis not present

## 2015-10-15 DIAGNOSIS — N401 Enlarged prostate with lower urinary tract symptoms: Secondary | ICD-10-CM | POA: Diagnosis not present

## 2015-10-15 DIAGNOSIS — Z7982 Long term (current) use of aspirin: Secondary | ICD-10-CM | POA: Diagnosis not present

## 2015-10-15 DIAGNOSIS — Z7951 Long term (current) use of inhaled steroids: Secondary | ICD-10-CM | POA: Diagnosis not present

## 2015-10-15 LAB — HEMOGLOBIN AND HEMATOCRIT, BLOOD
HCT: 26.4 % — ABNORMAL LOW (ref 39.0–52.0)
Hemoglobin: 8.9 g/dL — ABNORMAL LOW (ref 13.0–17.0)

## 2015-10-15 LAB — BASIC METABOLIC PANEL
Anion gap: 5 (ref 5–15)
BUN: 14 mg/dL (ref 6–20)
CO2: 26 mmol/L (ref 22–32)
CREATININE: 1 mg/dL (ref 0.61–1.24)
Calcium: 7.8 mg/dL — ABNORMAL LOW (ref 8.9–10.3)
Chloride: 106 mmol/L (ref 101–111)
GFR calc Af Amer: >60 mL/min (ref >60)
GFR calc non Af Amer: >60 mL/min (ref >60)
GLUCOSE: 214 mg/dL — AB (ref 65–99)
Potassium: 4.5 mmol/L (ref 3.5–5.1)
Sodium: 137 mmol/L (ref 135–145)

## 2015-10-15 LAB — GLUCOSE, CAPILLARY: Glucose-Capillary: 165 mg/dL — ABNORMAL HIGH (ref 65–99)

## 2015-10-15 MED ORDER — HYOSCYAMINE SULFATE ER 0.375 MG PO TB12
0.3750 mg | ORAL_TABLET | Freq: Two times a day (BID) | ORAL | Status: DC
  Administered 2015-10-15 – 2015-10-16 (×3): 0.375 mg via ORAL
  Filled 2015-10-15 (×4): qty 1

## 2015-10-15 MED ORDER — SODIUM CHLORIDE 0.45 % IV SOLN
INTRAVENOUS | Status: DC
  Administered 2015-10-15: 50 mL/h via INTRAVENOUS
  Administered 2015-10-16: 08:00:00 via INTRAVENOUS

## 2015-10-15 MED ORDER — SENNOSIDES-DOCUSATE SODIUM 8.6-50 MG PO TABS
1.0000 | ORAL_TABLET | Freq: Every day | ORAL | Status: DC
  Administered 2015-10-15: 1 via ORAL
  Filled 2015-10-15: qty 1

## 2015-10-15 MED ORDER — BACITRACIN-NEOMYCIN-POLYMYXIN OINTMENT TUBE
TOPICAL_OINTMENT | CUTANEOUS | Status: DC | PRN
  Filled 2015-10-15: qty 15

## 2015-10-15 MED ORDER — CALCIUM CARBONATE ANTACID 500 MG PO CHEW
1.0000 | CHEWABLE_TABLET | ORAL | Status: DC | PRN
  Administered 2015-10-15: 200 mg via ORAL
  Filled 2015-10-15: qty 1

## 2015-10-15 NOTE — Progress Notes (Signed)
Received report from [place] and agree with asessment,

## 2015-10-15 NOTE — Care Management Obs Status (Signed)
[place] NOTIFICATION   Patient Details  Name: Damon Navarro MRN: [id number] Date of Birth: Oct 21, 1935   Medicare Observation Status Notification Given:       Corine Shelter, RN 10/15/2015, 2:58 PM

## 2015-10-15 NOTE — Progress Notes (Signed)
Urology Progress Note  1 Day Post-Op :   Note: NO BLOOD PRODUCTS  Subjective: S/p open transvesical prostatectomy ( 130+ gm gland).     No acute urologic events overnight. Ambulation:   negative. Will walk today Flatus:    positive. On clear liquid diet Bowel movement  negative  Pain: some relief. Still c/o ? Bladder spasm. Has had B/O.   Objective:  30cc JP drain overnight  Blood pressure 93/46, pulse 79, temperature 97.9 F (36.6 C), temperature source Oral, resp. rate 16, height 6' (1.829 m), weight 67.302 kg (148 lb 6 oz), SpO2 97 %.  Physical Exam:  General:  No acute distress, awake Resp: clear to auscultation bilaterally Genitourinary:  Penile bruising,  Foley:  CBI: urine clear, but urine otherwise has some blood-starined. No clots overnight.     I/O last 3 completed shifts: In: 23885 [P.O.:60; I.V.:3575; Other:19200; IV Piggyback:1050] Out: [id number] [Urine:25950; Drains:210; Blood:300]  Recent Labs     10/14/15  1232  10/15/15  0442  HGB  11.6*  8.9*    Recent Labs     10/15/15  0442  NA  137  K  4.5  CL  106  CO2  26  BUN  14  CREATININE  1.00  CALCIUM  7.8*  GFRNONAA  >60  GFRAA  >60     No results for input(s): INR, APTT in the last 72 hours.  Invalid input(s): PT   Invalid input(s): ABG  Assessment/Plan:pt has been too weak to walk yet, but will try today. He will be advanced to regular diet, and will have levsin for bladder spasm. Will plan for d/c in AM if Hgb stabilizes.

## 2015-10-16 MED ORDER — MAGNESIUM HYDROXIDE 400 MG/5ML PO SUSP: 30.0000 mL | Freq: Every day | ORAL | Status: AC | PRN

## 2015-10-16 MED ORDER — TRAMADOL-ACETAMINOPHEN 37.5-325 MG PO TABS: 1.0000 | ORAL_TABLET | Freq: Four times a day (QID) | ORAL | Status: AC | PRN

## 2015-10-16 MED ORDER — TRIMETHOPRIM 100 MG PO TABS: 100.0000 mg | ORAL_TABLET | Freq: Every day | ORAL | Status: AC

## 2015-10-16 NOTE — Discharge Instructions (Signed)
Benign Prostatic Hyperplasia An enlarged prostate (benign prostatic hyperplasia) is common in older men. You may experience the following:  Weak urine stream.  Dribbling.  Feeling like the bladder has not emptied completely.  Difficulty starting urination.  Getting up frequently at night to urinate.  Urinating more frequently during the day. HOME CARE INSTRUCTIONS  Monitor your prostatic hyperplasia for any changes. The following actions may help to alleviate any discomfort you are experiencing:  Give yourself time when you urinate.  Stay away from alcohol.  Avoid beverages containing caffeine, such as coffee, tea, and colas, because they can make the problem worse.  Avoid decongestants, antihistamines, and some prescription medicines that can make the problem worse.  Follow up with your health care provider for further treatment as recommended. SEEK MEDICAL CARE IF:  You are experiencing progressive difficulty voiding.  Your urine stream is progressively getting narrower.  You are awaking from sleep with the urge to void more frequently.  You are constantly feeling the need to void.  You experience loss of urine, especially in small amounts. SEEK IMMEDIATE MEDICAL CARE IF:   You develop increased pain with urination or are unable to urinate.  You develop severe abdominal pain, vomiting, a high fever, or fainting.  You develop back pain or blood in your urine. MAKE SURE YOU:   Understand these instructions.  Will watch your condition.  Will get help right away if you are not doing well or get worse.   This information is not intended to replace advice given to you by your health care provider. Make sure you discuss any questions you have with your health care provider.   Document Released: 06/18/2005 Document Revised: 07/09/2014 Document Reviewed: 11/18/2012 Elsevier Interactive Patient Education 2016 [employer] Inc.  

## 2015-10-16 NOTE — Progress Notes (Signed)
Patient ambulate at 2145 around [place] side nursing station with no difficulties with the walker and myself as a standby for support if need. Will cont to monitor.

## 2015-10-16 NOTE — Discharge Summary (Signed)
Physician Discharge Summary  Patient ID: Damon Navarro MRN: [id number] DOB/AGE: October 14, 1935 80 y.o.  Admit date: 10/14/2015 Discharge date: 10/16/2015  Admission Diagnoses: [place]  Discharge Diagnoses:  Active Problems:   BPH (benign prostatic hyperplasia)   Discharged Condition: good  Hospital Course: open transvesical prostatectomy  Significant Diagnostic Studies: No results found.  Discharge Exam: Blood pressure 99/55, pulse 80, temperature 98 F (36.7 C), temperature source Oral, resp. rate 16, height 6' (1.829 m), weight 67.302 kg (148 lb 6 oz), SpO2 94 %.   Disposition: 01-Home or Self Care  Discharge Instructions    Care order/instruction    Complete by:  As directed   1. Shower and antibiotic ointment to foley catheter /penile glans junction.  2. Push fluids     Care order/instruction    Complete by:  As directed   D/c JP drain prior to discharge     Continue foley catheter    Complete by:  As directed      Discharge patient    Complete by:  As directed      Discontinue IV    Complete by:  As directed             Medication List    TAKE these medications        budesonide-formoterol 160-4.5 MCG/ACT inhaler  Commonly known as:  SYMBICORT  Inhale 2 puffs into the lungs 2 (two) times daily.     calcium carbonate 750 MG chewable tablet  Commonly known as:  TUMS EX  Chew 1 tablet by mouth daily as needed for heartburn.     magnesium hydroxide 400 MG/5ML suspension  Commonly known as:  MILK OF MAGNESIA  Take 30 mLs by mouth daily as needed for mild constipation.     metFORMIN 500 MG tablet  Commonly known as:  GLUCOPHAGE  Take by mouth 2 (two) times daily with a meal.     traMADol-acetaminophen 37.5-325 MG tablet  Commonly known as:  ULTRACET  Take 1 tablet by mouth every 6 (six) hours as needed.     trimethoprim 100 MG tablet  Commonly known as:  TRIMPEX  Take 1 tablet (100 mg total) by mouth daily.           Follow-up  Information    Call Ailene Rud, MD.   Specialty:  Urology   Contact information:   [place] [address] [phone]       Signed: Ailene Rud 10/16/2015, 1:30 PM

## 2015-10-16 NOTE — Progress Notes (Signed)
Urology Progress Note  2 Days Post-Op   Subjective: Post op open transvesical prostatectomy 2 days ago.     No acute urologic events overnight. Ambulation:   positive Flatus:    positive Bowel movement  positive  Pain: complete resolution  Objective:  Blood pressure 99/55, pulse 80, temperature 98 F (36.7 C), temperature source Oral, resp. rate 16, height 6' (1.829 m), weight 67.302 kg (148 lb 6 oz), SpO2 94 %.  Physical Exam:  General:  No acute distress, awake Extremities: extremities normal, atraumatic, no cyanosis or edema Genitourinary:  norrmal GU Foley: noj clots. Hematuria continues.     I/O last 3 completed shifts: In: 11818.3 [P.O.:660; I.V.:1358.3; Other:9800] Out: [id number]; Drains:85]  Recent Labs     10/14/15  1232  10/15/15  0442  HGB  11.6*  8.9*    Recent Labs     10/15/15  0442  NA  137  K  4.5  CL  106  CO2  26  BUN  14  CREATININE  1.00  CALCIUM  7.8*  GFRNONAA  >60  GFRAA  >60     No results for input(s): INR, APTT in the last 72 hours.  Invalid input(s): PT   Invalid input(s): ABG  Assessment/Plan: Hgb stable . Pt has walked and showered. Drain 55 cc total.   PLAN: Pt wants to go home: Will remove drain and allow hinm to go home.

## 2015-10-17 ENCOUNTER — Encounter (HOSPITAL_COMMUNITY): Payer: Self-pay | Admitting: Urology

## 2015-10-18 ENCOUNTER — Encounter (HOSPITAL_COMMUNITY): Payer: Self-pay | Admitting: Urology

## 2015-10-26 DIAGNOSIS — N138 Other obstructive and reflux uropathy: Secondary | ICD-10-CM | POA: Diagnosis not present

## 2015-10-26 DIAGNOSIS — N401 Enlarged prostate with lower urinary tract symptoms: Secondary | ICD-10-CM | POA: Diagnosis not present

## 2015-10-28 ENCOUNTER — Ambulatory Visit: Payer: PPO | Admitting: Nutrition

## 2015-11-04 DIAGNOSIS — N138 Other obstructive and reflux uropathy: Secondary | ICD-10-CM | POA: Diagnosis not present

## 2015-11-04 DIAGNOSIS — M6281 Muscle weakness (generalized): Secondary | ICD-10-CM | POA: Diagnosis not present

## 2015-11-04 DIAGNOSIS — N401 Enlarged prostate with lower urinary tract symptoms: Secondary | ICD-10-CM | POA: Diagnosis not present

## 2015-11-18 DIAGNOSIS — N138 Other obstructive and reflux uropathy: Secondary | ICD-10-CM | POA: Diagnosis not present

## 2015-11-18 DIAGNOSIS — N401 Enlarged prostate with lower urinary tract symptoms: Secondary | ICD-10-CM | POA: Diagnosis not present

## 2015-11-18 DIAGNOSIS — M6281 Muscle weakness (generalized): Secondary | ICD-10-CM | POA: Diagnosis not present

## 2015-11-24 ENCOUNTER — Ambulatory Visit: Payer: PPO | Admitting: Nutrition

## 2015-12-13 DIAGNOSIS — M6281 Muscle weakness (generalized): Secondary | ICD-10-CM | POA: Diagnosis not present

## 2015-12-13 DIAGNOSIS — N400 Enlarged prostate without lower urinary tract symptoms: Secondary | ICD-10-CM | POA: Diagnosis not present

## 2017-02-20 DIAGNOSIS — H52223 Regular astigmatism, bilateral: Secondary | ICD-10-CM | POA: Diagnosis not present

## 2017-02-20 DIAGNOSIS — E113393 Type 2 diabetes mellitus with moderate nonproliferative diabetic retinopathy without macular edema, bilateral: Secondary | ICD-10-CM | POA: Diagnosis not present

## 2017-02-20 DIAGNOSIS — H2513 Age-related nuclear cataract, bilateral: Secondary | ICD-10-CM | POA: Diagnosis not present

## 2017-02-20 DIAGNOSIS — H5203 Hypermetropia, bilateral: Secondary | ICD-10-CM | POA: Diagnosis not present

## 2017-05-20 DIAGNOSIS — E11319 Type 2 diabetes mellitus with unspecified diabetic retinopathy without macular edema: Secondary | ICD-10-CM | POA: Diagnosis not present

## 2017-05-20 DIAGNOSIS — E113393 Type 2 diabetes mellitus with moderate nonproliferative diabetic retinopathy without macular edema, bilateral: Secondary | ICD-10-CM | POA: Diagnosis not present

## 2018-12-09 DIAGNOSIS — E11319 Type 2 diabetes mellitus with unspecified diabetic retinopathy without macular edema: Secondary | ICD-10-CM | POA: Diagnosis not present

## 2019-01-19 DIAGNOSIS — H3561 Retinal hemorrhage, right eye: Secondary | ICD-10-CM | POA: Diagnosis not present

## 2019-01-19 DIAGNOSIS — H2511 Age-related nuclear cataract, right eye: Secondary | ICD-10-CM | POA: Diagnosis not present

## 2019-01-19 DIAGNOSIS — E113392 Type 2 diabetes mellitus with moderate nonproliferative diabetic retinopathy without macular edema, left eye: Secondary | ICD-10-CM | POA: Diagnosis not present

## 2019-01-19 DIAGNOSIS — E113311 Type 2 diabetes mellitus with moderate nonproliferative diabetic retinopathy with macular edema, right eye: Secondary | ICD-10-CM | POA: Diagnosis not present

## 2019-01-26 DIAGNOSIS — E113411 Type 2 diabetes mellitus with severe nonproliferative diabetic retinopathy with macular edema, right eye: Secondary | ICD-10-CM | POA: Diagnosis not present

## 2019-01-29 ENCOUNTER — Other Ambulatory Visit: Payer: Self-pay

## 2019-02-02 DIAGNOSIS — E113412 Type 2 diabetes mellitus with severe nonproliferative diabetic retinopathy with macular edema, left eye: Secondary | ICD-10-CM | POA: Diagnosis not present

## 2019-02-02 DIAGNOSIS — E113411 Type 2 diabetes mellitus with severe nonproliferative diabetic retinopathy with macular edema, right eye: Secondary | ICD-10-CM | POA: Diagnosis not present

## 2019-03-02 DIAGNOSIS — E113411 Type 2 diabetes mellitus with severe nonproliferative diabetic retinopathy with macular edema, right eye: Secondary | ICD-10-CM | POA: Diagnosis not present

## 2019-04-06 DIAGNOSIS — E113411 Type 2 diabetes mellitus with severe nonproliferative diabetic retinopathy with macular edema, right eye: Secondary | ICD-10-CM | POA: Diagnosis not present

## 2019-05-18 DIAGNOSIS — H3561 Retinal hemorrhage, right eye: Secondary | ICD-10-CM | POA: Diagnosis not present

## 2019-05-18 DIAGNOSIS — H2511 Age-related nuclear cataract, right eye: Secondary | ICD-10-CM | POA: Diagnosis not present

## 2019-05-18 DIAGNOSIS — E113412 Type 2 diabetes mellitus with severe nonproliferative diabetic retinopathy with macular edema, left eye: Secondary | ICD-10-CM | POA: Diagnosis not present

## 2019-05-18 DIAGNOSIS — E113411 Type 2 diabetes mellitus with severe nonproliferative diabetic retinopathy with macular edema, right eye: Secondary | ICD-10-CM | POA: Diagnosis not present

## 2019-06-01 DIAGNOSIS — E113411 Type 2 diabetes mellitus with severe nonproliferative diabetic retinopathy with macular edema, right eye: Secondary | ICD-10-CM | POA: Diagnosis not present

## 2019-06-01 DIAGNOSIS — E113412 Type 2 diabetes mellitus with severe nonproliferative diabetic retinopathy with macular edema, left eye: Secondary | ICD-10-CM | POA: Diagnosis not present

## 2019-06-23 DIAGNOSIS — Z09 Encounter for follow-up examination after completed treatment for conditions other than malignant neoplasm: Secondary | ICD-10-CM | POA: Diagnosis not present

## 2019-06-23 DIAGNOSIS — E113411 Type 2 diabetes mellitus with severe nonproliferative diabetic retinopathy with macular edema, right eye: Secondary | ICD-10-CM | POA: Diagnosis not present

## 2019-06-23 DIAGNOSIS — E113412 Type 2 diabetes mellitus with severe nonproliferative diabetic retinopathy with macular edema, left eye: Secondary | ICD-10-CM | POA: Diagnosis not present

## 2019-08-04 DIAGNOSIS — E113412 Type 2 diabetes mellitus with severe nonproliferative diabetic retinopathy with macular edema, left eye: Secondary | ICD-10-CM | POA: Diagnosis not present

## 2019-08-04 DIAGNOSIS — E113411 Type 2 diabetes mellitus with severe nonproliferative diabetic retinopathy with macular edema, right eye: Secondary | ICD-10-CM | POA: Diagnosis not present

## 2019-09-15 DIAGNOSIS — H2511 Age-related nuclear cataract, right eye: Secondary | ICD-10-CM | POA: Diagnosis not present

## 2019-09-15 DIAGNOSIS — H43811 Vitreous degeneration, right eye: Secondary | ICD-10-CM | POA: Diagnosis not present

## 2019-09-15 DIAGNOSIS — E113411 Type 2 diabetes mellitus with severe nonproliferative diabetic retinopathy with macular edema, right eye: Secondary | ICD-10-CM | POA: Diagnosis not present

## 2019-10-01 ENCOUNTER — Ambulatory Visit: Payer: PPO | Attending: Internal Medicine

## 2019-10-01 DIAGNOSIS — Z23 Encounter for immunization: Secondary | ICD-10-CM

## 2019-10-01 NOTE — Progress Notes (Signed)
   Covid-19 Vaccination Clinic  Name:  ISADOR FACEMIRE    MRN: [id number] DOB: 09/12/1935  10/01/2019  Mr. Pilcher was observed post Covid-19 immunization for 15 minutes without incident. He was provided with Vaccine Information Sheet and instruction to access the V-Safe system.   Mr. Kraatz was instructed to call 911 with any severe reactions post vaccine: Marland Kitchen Difficulty breathing  . Swelling of face and throat  . A fast heartbeat  . A bad rash all over body  . Dizziness and weakness   Immunizations Administered    Name Date Dose VIS Date Route   Moderna COVID-19 Vaccine 10/01/2019  9:16 AM 0.5 mL 06/02/2019 Intramuscular   Manufacturer: Moderna   Lot: [id number]   [id number]

## 2019-10-27 ENCOUNTER — Encounter (INDEPENDENT_AMBULATORY_CARE_PROVIDER_SITE_OTHER): Payer: Self-pay | Admitting: Ophthalmology

## 2019-10-27 ENCOUNTER — Ambulatory Visit (INDEPENDENT_AMBULATORY_CARE_PROVIDER_SITE_OTHER): Payer: PPO | Admitting: Ophthalmology

## 2019-10-27 ENCOUNTER — Other Ambulatory Visit: Payer: Self-pay

## 2019-10-27 DIAGNOSIS — H43811 Vitreous degeneration, right eye: Secondary | ICD-10-CM | POA: Insufficient documentation

## 2019-10-27 DIAGNOSIS — H2511 Age-related nuclear cataract, right eye: Secondary | ICD-10-CM | POA: Diagnosis not present

## 2019-10-27 DIAGNOSIS — H3561 Retinal hemorrhage, right eye: Secondary | ICD-10-CM | POA: Diagnosis not present

## 2019-10-27 DIAGNOSIS — E113411 Type 2 diabetes mellitus with severe nonproliferative diabetic retinopathy with macular edema, right eye: Secondary | ICD-10-CM | POA: Diagnosis not present

## 2019-10-27 DIAGNOSIS — E113412 Type 2 diabetes mellitus with severe nonproliferative diabetic retinopathy with macular edema, left eye: Secondary | ICD-10-CM | POA: Diagnosis not present

## 2019-10-27 DIAGNOSIS — H2512 Age-related nuclear cataract, left eye: Secondary | ICD-10-CM | POA: Diagnosis not present

## 2019-10-27 MED ORDER — BEVACIZUMAB CHEMO INJECTION 1.25MG/0.05ML SYRINGE FOR KALEIDOSCOPE
1.2500 mg | INTRAVITREAL | Status: AC | PRN
  Administered 2019-10-27: 1.25 mg via INTRAVITREAL

## 2019-10-27 NOTE — Progress Notes (Signed)
10/27/2019     CHIEF COMPLAINT Patient presents for Retina Follow Up   HISTORY OF PRESENT ILLNESS: Damon Navarro is a 84 y.o. male who presents to the clinic today for:   HPI    Retina Follow Up    Patient presents with  Diabetic Retinopathy.  In both eyes.  This started 6 weeks ago.  Severity is moderate.  Duration of 6 weeks.  Since onset it is stable.          Comments    6 week follow up with possible Avastin injection OD today. VA appears stable since last visit.  Denies any new problems, no pain/ discomfort OU. LBS: 113 this am  A1C: unsure       Last edited by Leonie Douglas, [place] on 10/27/2019  9:54 AM. (History)      Referring physician: Sinda Du, MD No address on file  HISTORICAL INFORMATION:   Selected notes from the [facility]    Lab Results  Component Value Date   HGBA1C 7.0 (H) 10/07/2015     CURRENT MEDICATIONS: No current outpatient medications on file. (Ophthalmic Drugs)   No current facility-administered medications for this visit. (Ophthalmic Drugs)   Current Outpatient Medications (Other)  Medication Sig  . budesonide-formoterol (SYMBICORT) 160-4.5 MCG/ACT inhaler Inhale 2 puffs into the lungs 2 (two) times daily.  . calcium carbonate (TUMS EX) 750 MG chewable tablet Chew 1 tablet by mouth daily as needed for heartburn.  . magnesium hydroxide (MILK OF MAGNESIA) 400 MG/5ML suspension Take 30 mLs by mouth daily as needed for mild constipation.  . metFORMIN (GLUCOPHAGE) 500 MG tablet Take by mouth 2 (two) times daily with a meal.  . traMADol-acetaminophen (ULTRACET) 37.5-325 MG tablet Take 1 tablet by mouth every 6 (six) hours as needed.  . trimethoprim (TRIMPEX) 100 MG tablet Take 1 tablet (100 mg total) by mouth daily.   No current facility-administered medications for this visit. (Other)      REVIEW OF SYSTEMS:    ALLERGIES Allergies  Allergen Reactions  . Other Other (See Comments)    NO BLOOD PRODUCTS  . Aspirin  Hives    PAST MEDICAL HISTORY Past Medical History:  Diagnosis Date  . Asthma   . Bronchitis    last bout 2-3 weeks ago   . COPD (chronic obstructive pulmonary disease) ([place])   . Diabetes mellitus without complication ([place])   . GERD (gastroesophageal reflux disease)   . Hemorrhoids   . Nocturia   . Shortness of breath dyspnea    with congestion only   . Urinary hesitancy    Past Surgical History:  Procedure Laterality Date  . BACK SURGERY    . NOSE SURGERY    . PROSTATECTOMY N/A 10/14/2015   Procedure: PROSTATECTOMY TRANSVESICAL;  Surgeon: Carolan Clines, MD;  Location: [facility];  Service: Urology;  Laterality: N/A;    FAMILY HISTORY History reviewed. No pertinent family history.  SOCIAL HISTORY Social History   Tobacco Use  . Smoking status: Never Smoker  . Smokeless tobacco: Never Used  Substance Use Topics  . Alcohol use: No  . Drug use: No         OPHTHALMIC EXAM: Base Eye Exam    Visual Acuity (ETDRS)      Right Left   Dist [place] 20/30 +1 20/25 +2   Dist ph [place] NI        Tonometry (Tonopen, 10:01 AM)      Right Left   Pressure  14 16       Pupils      Pupils Dark Light Shape React APD   Right PERRL 4 3 Round Brisk None   Left PERRL 4 3 Round Brisk None       Visual Fields (Counting fingers)      Left Right    Full Full       Extraocular Movement      Right Left    Full Full       Neuro/Psych    Oriented x3: Yes   Mood/Affect: Normal       Dilation    Right eye: 1.0% Mydriacyl, 2.5% Phenylephrine @ 10:01 AM          IMAGING AND PROCEDURES  Imaging and Procedures for 10/27/19  OCT, Retina - OU - Both Eyes       Right Eye Quality was good. Central Foveal Thickness: 331. Findings include abnormal foveal contour, cystoid macular edema.   Left Eye Quality was good. Central Foveal Thickness: 279. Findings include abnormal foveal contour, cystoid macular edema.                 ASSESSMENT/PLAN:  No problem-specific  Assessment & Plan notes found for this encounter.      ICD-10-CM   1. Severe nonproliferative diabetic retinopathy of right eye, with macular edema, associated with type 2 diabetes mellitus (HCC)  E11.3411 OCT, Retina - OU - Both Eyes  2. Severe nonproliferative diabetic retinopathy of left eye, with macular edema, associated with type 2 diabetes mellitus (HCC)  [id number] OCT, Retina - OU - Both Eyes  3. Retinal hemorrhage of right eye  H35.61   4. Nuclear sclerotic cataract of right eye  H25.11   5. Nuclear sclerotic cataract of left eye  H25.12   6. Posterior vitreous detachment of right eye  H43.811     1.  2.  3.  Ophthalmic Meds Ordered this visit:  No orders of the defined types were placed in this encounter.      No follow-ups on file.  There are no Patient Instructions on file for this visit.   Explained the diagnoses, plan, and follow up with the patient and they expressed understanding.  Patient expressed understanding of the importance of proper follow up care.   Clent Demark Taysia Rivere M.D. Diseases & Surgery of the Retina and Vitreous [facility] [place] 10/27/19     Abbreviations: M myopia (nearsighted); A astigmatism; H hyperopia (farsighted); P presbyopia; Mrx spectacle prescription;  CTL contact lenses; OD right eye; OS left eye; OU both eyes  XT exotropia; ET esotropia; PEK punctate epithelial keratitis; PEE punctate epithelial erosions; DES dry eye syndrome; MGD meibomian gland dysfunction; ATs artificial tears; PFAT's preservative free artificial tears; [place] nuclear sclerotic cataract; PSC posterior subcapsular cataract; ERM epi-retinal membrane; PVD posterior vitreous detachment; RD retinal detachment; DM diabetes mellitus; DR diabetic retinopathy; NPDR non-proliferative diabetic retinopathy; PDR proliferative diabetic retinopathy; CSME clinically significant macular edema; DME diabetic macular edema; dbh dot blot hemorrhages; CWS cotton wool spot; POAG  primary open angle glaucoma; C/D cup-to-disc ratio; HVF humphrey visual field; GVF goldmann visual field; OCT optical coherence tomography; IOP intraocular pressure; BRVO Branch retinal vein occlusion; CRVO central retinal vein occlusion; CRAO central retinal artery occlusion; BRAO branch retinal artery occlusion; RT retinal tear; SB scleral buckle; PPV pars plana vitrectomy; VH Vitreous hemorrhage; PRP panretinal laser photocoagulation; IVK intravitreal kenalog; VMT vitreomacular traction; MH Macular hole;  NVD neovascularization of the disc; NVE neovascularization  elsewhere; AREDS age related eye disease study; ARMD age related macular degeneration; POAG primary open angle glaucoma; EBMD epithelial/anterior basement membrane dystrophy; ACIOL anterior chamber intraocular lens; IOL intraocular lens; PCIOL posterior chamber intraocular lens; Phaco/IOL phacoemulsification with intraocular lens placement; PRK photorefractive keratectomy; LASIK laser assisted in situ keratomileusis; HTN hypertension; DM diabetes mellitus; COPD chronic obstructive pulmonary disease 

## 2019-10-27 NOTE — Assessment & Plan Note (Signed)
The nature of diabetic macular edema was discussed with the patient. Treatment options were outlined including medical therapy, laser & vitrectomy. The use of injectable medications reviewed, including Avastin, Lucentis, and Eylea. Periodic injections into the eye are likely to resolve diabetic macular edema (swelling in the center of vision). Initially, injections are delivered are delivered every 4-6 weeks, and the interval extended as the condition improves. On average, 8-9 injections the first year, and 5 in year 2. Improvement in the condition most often improves on medical therapy. Occasional use of focal laser is also recommended for residual macular edema (swelling). Excellent control of blood glucose and blood pressure are encouraged under the care of a primary physician or endocrinologist. Similarly, attempts to maintain serum cholesterol, low density lipoproteins, and high-density lipoproteins in a favorable range were recommended. OD,,  IMPROVED, STILL ACTIVE REPEAT AVASTIN OD NOW

## 2019-10-27 NOTE — Patient Instructions (Signed)
Diabetic Retinopathy Diabetic retinopathy is a disease of the retina. The retina is a light-sensitive membrane at the back of the eye. Retinopathy is a complication of diabetes (diabetes mellitus) and a common cause of bad eyesight (visual impairment). It can eventually cause blindness. Early detection and treatment of diabetic retinopathy is important in keeping your eyes healthy and preventing further damage to them. What are the causes? Diabetic retinopathy is caused by blood sugar (glucose) levels that are too high for an extended period of time. High blood glucose over an extended period of time can:  Damage small blood vessels in the retina, allowing blood to leak through the vessel walls.  Cause new, abnormal blood vessels to grow on the retina. This can scar the retina in the advanced stage of diabetic retinopathy. What increases the risk? You are more likely to develop this condition if:  You have had diabetes for a long time.  You have poorly controlled blood glucose.  You have high blood pressure. What are the signs or symptoms? In the early stages of diabetic retinopathy, there are often no symptoms. As the condition gets worse, symptoms may include:  Blurred vision. This is usually caused by swelling due to abnormal blood glucose levels. The blurriness may go away when blood glucose levels return to normal.  Moving specks or dark spots (floaters) in your vision. These can be caused by a small amount of bleeding (hemorrhage) from retinal blood vessels.  Missing parts of your field of vision, such as vision at the sides of the eyes. This can be caused by larger retinal hemorrhages.  Difficulty reading.  Double vision.  Pain in one or both eyes.  Feeling pressure in one or both eyes.  Trouble seeing straight lines. Straight lines may not look straight.  Redness of the eyes that does not go away. How is this diagnosed? This condition may be diagnosed with an eye exam in  which your eye care specialist puts drops in your eyes that enlarge (dilate) your pupils. This lets your health care provider examine your retina and check for changes in your retinal blood vessels. How is this treated? This condition may be treated by:  Keeping your blood glucose and blood pressure within a target range.  Using a type of laser beam to seal your retinal blood vessels. This stops them from bleeding and decreases pressure in your eye.  Getting shots of medicine in the eye to reduce swelling of the center of the retina (macula). You may be given: ? Anti-VEGF medicine. This medicine can help slow vision loss, and may even improve vision. ? Steroid medicine. Follow these instructions at home:   Follow your diabetes management plan as directed by your health care provider. This may include exercising regularly and eating a healthy diet.  Keep your blood glucose level and your blood pressure in your target range, as directed by your health care provider.  Check your blood glucose as often as directed.  Take over the counter and prescription medicines only as told by your health care provider. This includes insulin and oral diabetes medicine.  Get your eyes checked at least once every year. An eye specialist can usually see diabetic retinopathy developing long before it starts to cause problems. In many cases, it can be treated to prevent complications from occurring.  Do not use any products that contain nicotine or tobacco, such as cigarettes and e-cigarettes. If you need help quitting, ask your health care provider.  Keep all follow-up   visits as told by your health care provider. This is important. Contact a health care provider if:  You notice gradual blurring or other changes in your vision over time.  You notice that your glasses or contact lenses do not make things look as sharp as they once did.  You have trouble reading or seeing details at a distance with either  eye.  You notice a change in your vision or notice that parts of your field of vision appear missing or hazy.  You suddenly see moving specks or dark spots in the field of vision of either eye. Get help right away if:  You have sudden pain or pressure in one or both eyes.  You suddenly lose vision or a curtain or veil seems to come across your eyes.  You have a sudden burst of floaters in your vision. Summary  Diabetic retinopathy is a disease of the retina. The retina is a light-sensitive membrane at the back of the eye. Retinopathy is a complication of diabetes.  Get your eyes checked at least once every year. An eye specialist can usually see diabetic retinopathy developing long before it starts to cause problems. In many cases, it can be treated to prevent complications from occurring.  Keep your blood glucose and your blood pressure in target range. Follow your diabetes management plan as directed by your health care provider.  Protect your eyes. Wear sunglasses and eye protection when needed. This information is not intended to replace advice given to you by your health care provider. Make sure you discuss any questions you have with your health care provider. Document Revised: 07/31/2017 Document Reviewed: 07/23/2016 Elsevier Patient Education  2020 [employer] Inc.  

## 2019-11-03 ENCOUNTER — Ambulatory Visit: Payer: PPO | Attending: Internal Medicine

## 2019-11-03 DIAGNOSIS — Z23 Encounter for immunization: Secondary | ICD-10-CM

## 2019-11-03 NOTE — Progress Notes (Signed)
   Covid-19 Vaccination Clinic  Name:  Damon Navarro    MRN: [id number] DOB: June 07, 1936  11/03/2019  [name] was observed post Covid-19 immunization for 15 minutes without incident. He was provided with Vaccine Information Sheet and instruction to access the V-Safe system.   [name] was instructed to call 911 with any severe reactions post vaccine: Marland Kitchen Difficulty breathing  . Swelling of face and throat  . A fast heartbeat  . A bad rash all over body  . Dizziness and weakness   Immunizations Administered    Name Date Dose VIS Date Route   Moderna COVID-19 Vaccine 11/03/2019  8:29 AM 0.5 mL 06/2019 Intramuscular   Manufacturer: Moderna   Lot: [id number]   [id number]

## 2019-12-08 ENCOUNTER — Ambulatory Visit (INDEPENDENT_AMBULATORY_CARE_PROVIDER_SITE_OTHER): Payer: PPO | Admitting: Ophthalmology

## 2019-12-08 ENCOUNTER — Other Ambulatory Visit: Payer: Self-pay

## 2019-12-08 ENCOUNTER — Encounter (INDEPENDENT_AMBULATORY_CARE_PROVIDER_SITE_OTHER): Payer: Self-pay | Admitting: Ophthalmology

## 2019-12-08 DIAGNOSIS — E113412 Type 2 diabetes mellitus with severe nonproliferative diabetic retinopathy with macular edema, left eye: Secondary | ICD-10-CM

## 2019-12-08 DIAGNOSIS — E113411 Type 2 diabetes mellitus with severe nonproliferative diabetic retinopathy with macular edema, right eye: Secondary | ICD-10-CM | POA: Diagnosis not present

## 2019-12-08 MED ORDER — BEVACIZUMAB CHEMO INJECTION 1.25MG/0.05ML SYRINGE FOR KALEIDOSCOPE
1.2500 mg | INTRAVITREAL | Status: AC | PRN
  Administered 2019-12-08: 1.25 mg via INTRAVITREAL

## 2019-12-08 NOTE — Assessment & Plan Note (Signed)
Repeat intravitreal Avastin today as CSME is slightly improved again.  AntivegF will also slow the progression of severe NPDR.

## 2019-12-08 NOTE — Assessment & Plan Note (Signed)
Will monitor OS closely, if thickening continues to worsen may need focal laser treatment inferiorly

## 2019-12-08 NOTE — Progress Notes (Signed)
12/08/2019     CHIEF COMPLAINT Patient presents for Retina Follow Up   HISTORY OF PRESENT ILLNESS: Damon Navarro is a 84 y.o. male who presents to the clinic today for:   HPI    Retina Follow Up    Patient presents with  Diabetic Retinopathy.  In right eye.  Duration of 6 weeks.  Since onset it is stable.          Comments    6 week follow up - OCT OU Patient denies change in vision and overall has no complaints. LBS 101 this AM A1C Unknown       Last edited by Gerda Diss on 12/08/2019 10:04 AM. (History)      Referring physician: Sinda Du, MD No address on file  HISTORICAL INFORMATION:   Selected notes from the [facility]    Lab Results  Component Value Date   HGBA1C 7.0 (H) 10/07/2015     CURRENT MEDICATIONS: No current outpatient medications on file. (Ophthalmic Drugs)   No current facility-administered medications for this visit. (Ophthalmic Drugs)   Current Outpatient Medications (Other)  Medication Sig   budesonide-formoterol (SYMBICORT) 160-4.5 MCG/ACT inhaler Inhale 2 puffs into the lungs 2 (two) times daily.   calcium carbonate (TUMS EX) 750 MG chewable tablet Chew 1 tablet by mouth daily as needed for heartburn.   magnesium hydroxide (MILK OF MAGNESIA) 400 MG/5ML suspension Take 30 mLs by mouth daily as needed for mild constipation.   metFORMIN (GLUCOPHAGE) 500 MG tablet Take by mouth 2 (two) times daily with a meal.   traMADol-acetaminophen (ULTRACET) 37.5-325 MG tablet Take 1 tablet by mouth every 6 (six) hours as needed.   trimethoprim (TRIMPEX) 100 MG tablet Take 1 tablet (100 mg total) by mouth daily.   No current facility-administered medications for this visit. (Other)      REVIEW OF SYSTEMS:    ALLERGIES Allergies  Allergen Reactions   Other Other (See Comments)    NO BLOOD PRODUCTS   Aspirin Hives    PAST MEDICAL HISTORY Past Medical History:  Diagnosis Date   Asthma    Bronchitis    last  bout 2-3 weeks ago    COPD (chronic obstructive pulmonary disease) (HCC)    Diabetes mellitus without complication (HCC)    GERD (gastroesophageal reflux disease)    Hemorrhoids    Nocturia    Shortness of breath dyspnea    with congestion only    Urinary hesitancy    Past Surgical History:  Procedure Laterality Date   BACK SURGERY     NOSE SURGERY     PROSTATECTOMY N/A 10/14/2015   Procedure: PROSTATECTOMY TRANSVESICAL;  Surgeon: Carolan Clines, MD;  Location: [facility];  Service: Urology;  Laterality: N/A;    FAMILY HISTORY History reviewed. No pertinent family history.  SOCIAL HISTORY Social History   Tobacco Use   Smoking status: Never Smoker   Smokeless tobacco: Never Used  Substance Use Topics   Alcohol use: No   Drug use: No         OPHTHALMIC EXAM:  Base Eye Exam    Visual Acuity (Snellen - Linear)      Right Left   Dist [place] 20/30-1 20/25-2   Dist ph [place] NI        Tonometry (Tonopen, 10:08 AM)      Right Left   Pressure 14 15       Pupils      Pupils Dark Light Shape React APD  Right PERRL 4 3 Round Brisk None   Left PERRL 4 3 Round Brisk None       Visual Fields (Counting fingers)      Left Right    Full Full       Extraocular Movement      Right Left    Full Full       Neuro/Psych    Oriented x3: Yes   Mood/Affect: Normal       Dilation    Both eyes: 1.0% Mydriacyl, 2.5% Phenylephrine @ 10:08 AM        Slit Lamp and Fundus Exam    External Exam      Right Left   External Normal Normal       Slit Lamp Exam      Right Left   Lids/Lashes Normal Normal   Conjunctiva/Sclera White and quiet White and quiet   Cornea Clear Clear   Anterior Chamber Deep and quiet Deep and quiet   Iris Round and reactive Round and reactive   Lens 2+ Nuclear sclerosis 1+ Nuclear sclerosis   Anterior Vitreous Normal Normal       Fundus Exam      Right Left   Posterior Vitreous Posterior vitreous detachment Posterior vitreous  detachment   Disc Normal Normal   C/D Ratio 0.4 0.4   Macula Mild clinically significant macular edema, Microaneurysms Mild clinically significant macular edema, Microaneurysms   Vessels NPDR-Severe  NPDR-Severe   Periphery Normal Normal          IMAGING AND PROCEDURES  Imaging and Procedures for 12/08/19  OCT, Retina - OU - Both Eyes       Right Eye Quality was good. Scan locations included subfoveal. Central Foveal Thickness: 325. Progression has improved.   Left Eye Quality was good. Scan locations included subfoveal. Central Foveal Thickness: 308. Progression has been stable.   Notes OD with clinically significant macular edema, improved on intravitreal Avastin some 6 weeks previous.  We will repeat intravitreal Avastin OD today and examination in 6 weeks OD       Intravitreal Injection, Pharmacologic Agent - OD - Right Eye       Time Out 12/08/2019. 11:09 AM. Confirmed correct patient, procedure, site, and patient consented.   Anesthesia Topical anesthesia was used. Anesthetic medications included Akten 3.5%.   Procedure Preparation included Tobramycin 0.3%, 10% betadine to eyelids. A 30 gauge needle was used.   Injection:  1.25 mg Bevacizumab (AVASTIN) SOLN   NDC: [address]-4, Lot: [id number]   Route: Intravitreal, Site: Right Eye, Waste: 0 mg  Post-op Post injection exam found visual acuity of at least counting fingers. The patient tolerated the procedure well. There were no complications. The patient received written and verbal post procedure care education. Post injection medications were not given.                 ASSESSMENT/PLAN:  Severe nonproliferative diabetic retinopathy of right eye, with macular edema, associated with type 2 diabetes mellitus ([place]) Repeat intravitreal Avastin today as CSME is slightly improved again.  AntivegF will also slow the progression of severe NPDR.  Severe nonproliferative diabetic retinopathy of left eye, with macular  edema, associated with type 2 diabetes mellitus ([place]) Will monitor OS closely, if thickening continues to worsen may need focal laser treatment inferiorly      ICD-10-CM   1. Severe nonproliferative diabetic retinopathy of right eye, with macular edema, associated with type 2 diabetes mellitus ([place])  E11.3411 OCT, Retina -  OU - Both Eyes    Intravitreal Injection, Pharmacologic Agent - OD - Right Eye    Bevacizumab (AVASTIN) SOLN 1.25 mg  2. Severe nonproliferative diabetic retinopathy of left eye, with macular edema, associated with type 2 diabetes mellitus ([place])  X41.2878     1.  OD, repeat intravitreal Avastin today for clinically significant macular edema and prevent NPDR severe,  progression  2.  OS, mild CSME temporally and inferiorly, will monitor closely, if this worsens may offer focal laser  3.  Ophthalmic Meds Ordered this visit:  Meds ordered this encounter  Medications   Bevacizumab (AVASTIN) SOLN 1.25 mg       Return in about 6 weeks (around 01/19/2020) for dilate, OD, AVASTIN OCT.  There are no Patient Instructions on file for this visit.   Explained the diagnoses, plan, and follow up with the patient and they expressed understanding.  Patient expressed understanding of the importance of proper follow up care.   Clent Demark Modupe Shampine M.D. Diseases & Surgery of the Retina and Vitreous [facility] [place] 12/08/19     Abbreviations: M myopia (nearsighted); A astigmatism; H hyperopia (farsighted); P presbyopia; Mrx spectacle prescription;  CTL contact lenses; OD right eye; OS left eye; OU both eyes  XT exotropia; ET esotropia; PEK punctate epithelial keratitis; PEE punctate epithelial erosions; DES dry eye syndrome; MGD meibomian gland dysfunction; ATs artificial tears; PFAT's preservative free artificial tears; [place] nuclear sclerotic cataract; PSC posterior subcapsular cataract; ERM epi-retinal membrane; PVD posterior vitreous detachment; RD retinal detachment;  DM diabetes mellitus; DR diabetic retinopathy; NPDR non-proliferative diabetic retinopathy; PDR proliferative diabetic retinopathy; CSME clinically significant macular edema; DME diabetic macular edema; dbh dot blot hemorrhages; CWS cotton wool spot; POAG primary open angle glaucoma; C/D cup-to-disc ratio; HVF humphrey visual field; GVF goldmann visual field; OCT optical coherence tomography; IOP intraocular pressure; BRVO Branch retinal vein occlusion; CRVO central retinal vein occlusion; CRAO central retinal artery occlusion; BRAO branch retinal artery occlusion; RT retinal tear; SB scleral buckle; PPV pars plana vitrectomy; VH Vitreous hemorrhage; PRP panretinal laser photocoagulation; IVK intravitreal kenalog; VMT vitreomacular traction; MH Macular hole;  NVD neovascularization of the disc; NVE neovascularization elsewhere; AREDS age related eye disease study; ARMD age related macular degeneration; POAG primary open angle glaucoma; EBMD epithelial/anterior basement membrane dystrophy; ACIOL anterior chamber intraocular lens; IOL intraocular lens; PCIOL posterior chamber intraocular lens; Phaco/IOL phacoemulsification with intraocular lens placement; [place] photorefractive keratectomy; LASIK laser assisted in situ keratomileusis; HTN hypertension; DM diabetes mellitus; COPD chronic obstructive pulmonary disease

## 2020-01-12 DIAGNOSIS — Z681 Body mass index (BMI) 19 or less, adult: Secondary | ICD-10-CM | POA: Diagnosis not present

## 2020-01-12 DIAGNOSIS — R739 Hyperglycemia, unspecified: Secondary | ICD-10-CM | POA: Diagnosis not present

## 2020-01-12 DIAGNOSIS — E1165 Type 2 diabetes mellitus with hyperglycemia: Secondary | ICD-10-CM | POA: Diagnosis not present

## 2020-01-12 DIAGNOSIS — K219 Gastro-esophageal reflux disease without esophagitis: Secondary | ICD-10-CM | POA: Diagnosis not present

## 2020-01-12 DIAGNOSIS — J302 Other seasonal allergic rhinitis: Secondary | ICD-10-CM | POA: Diagnosis not present

## 2020-01-12 DIAGNOSIS — E11319 Type 2 diabetes mellitus with unspecified diabetic retinopathy without macular edema: Secondary | ICD-10-CM | POA: Diagnosis not present

## 2020-01-12 DIAGNOSIS — J45909 Unspecified asthma, uncomplicated: Secondary | ICD-10-CM | POA: Diagnosis not present

## 2020-01-19 ENCOUNTER — Other Ambulatory Visit: Payer: Self-pay

## 2020-01-19 ENCOUNTER — Encounter (INDEPENDENT_AMBULATORY_CARE_PROVIDER_SITE_OTHER): Payer: Self-pay | Admitting: Ophthalmology

## 2020-01-19 ENCOUNTER — Ambulatory Visit (INDEPENDENT_AMBULATORY_CARE_PROVIDER_SITE_OTHER): Payer: PPO | Admitting: Ophthalmology

## 2020-01-19 DIAGNOSIS — E113411 Type 2 diabetes mellitus with severe nonproliferative diabetic retinopathy with macular edema, right eye: Secondary | ICD-10-CM

## 2020-01-19 DIAGNOSIS — E113412 Type 2 diabetes mellitus with severe nonproliferative diabetic retinopathy with macular edema, left eye: Secondary | ICD-10-CM | POA: Diagnosis not present

## 2020-01-19 MED ORDER — BEVACIZUMAB CHEMO INJECTION 1.25MG/0.05ML SYRINGE FOR KALEIDOSCOPE
1.2500 mg | INTRAVITREAL | Status: AC | PRN
  Administered 2020-01-19: 1.25 mg via INTRAVITREAL

## 2020-01-19 NOTE — Assessment & Plan Note (Signed)
OD, follow-up today at 6-week interval with slightly improved juxta foveal CSME will repeat intravitreal OD Avastin.

## 2020-01-19 NOTE — Progress Notes (Signed)
01/19/2020     CHIEF COMPLAINT Patient presents for Retina Follow Up   HISTORY OF PRESENT ILLNESS: Damon Navarro is a 84 y.o. male who presents to the clinic today for:   HPI    Retina Follow Up    Patient presents with  Diabetic Retinopathy.  In right eye.  This started 6 weeks ago.  Duration of 6 weeks.  Since onset it is stable.          Comments    6 week f/u dilated exam, OCT(MAC) and possible Avastin OD today.  Pt denies noticeable changes to [place] OU since last visit. Pt denies ocular pain, flashes of light, or floaters OU.  LBS was 110 this a.m        Last edited by Melburn Popper, COA on 01/19/2020 10:35 AM. (History)      Referring physician: Sinda Du, MD No address on file  HISTORICAL INFORMATION:   Selected notes from the [facility]    Lab Results  Component Value Date   HGBA1C 7.0 (H) 10/07/2015     CURRENT MEDICATIONS: No current outpatient medications on file. (Ophthalmic Drugs)   No current facility-administered medications for this visit. (Ophthalmic Drugs)   Current Outpatient Medications (Other)  Medication Sig  . budesonide-formoterol (SYMBICORT) 160-4.5 MCG/ACT inhaler Inhale 2 puffs into the lungs 2 (two) times daily.  . calcium carbonate (TUMS EX) 750 MG chewable tablet Chew 1 tablet by mouth daily as needed for heartburn.  . magnesium hydroxide (MILK OF MAGNESIA) 400 MG/5ML suspension Take 30 mLs by mouth daily as needed for mild constipation.  . metFORMIN (GLUCOPHAGE) 500 MG tablet Take by mouth 2 (two) times daily with a meal.  . traMADol-acetaminophen (ULTRACET) 37.5-325 MG tablet Take 1 tablet by mouth every 6 (six) hours as needed.  . trimethoprim (TRIMPEX) 100 MG tablet Take 1 tablet (100 mg total) by mouth daily.   No current facility-administered medications for this visit. (Other)      REVIEW OF SYSTEMS:    ALLERGIES Allergies  Allergen Reactions  . Other Other (See Comments)    NO BLOOD PRODUCTS    . Aspirin Hives    PAST MEDICAL HISTORY Past Medical History:  Diagnosis Date  . Asthma   . Bronchitis    last bout 2-3 weeks ago   . COPD (chronic obstructive pulmonary disease) ([place])   . Diabetes mellitus without complication ([place])   . GERD (gastroesophageal reflux disease)   . Hemorrhoids   . Nocturia   . Shortness of breath dyspnea    with congestion only   . Urinary hesitancy    Past Surgical History:  Procedure Laterality Date  . BACK SURGERY    . NOSE SURGERY    . PROSTATECTOMY N/A 10/14/2015   Procedure: PROSTATECTOMY TRANSVESICAL;  Surgeon: Carolan Clines, MD;  Location: [facility];  Service: Urology;  Laterality: N/A;    FAMILY HISTORY History reviewed. No pertinent family history.  SOCIAL HISTORY Social History   Tobacco Use  . Smoking status: Never Smoker  . Smokeless tobacco: Never Used  Substance Use Topics  . Alcohol use: No  . Drug use: No         OPHTHALMIC EXAM:  Base Eye Exam    Visual Acuity (ETDRS)      Right Left   Dist [place] 20/30 +1 20/30 -1   Dist ph [place] NI 20/25       Tonometry (Tonopen, 10:39 AM)  Right Left   Pressure 12 14       Pupils      Pupils Dark Light Shape React APD   Right PERRL 4 3 Round Brisk None   Left PERRL 4 3 Round Brisk None       Visual Fields (Counting fingers)      Left Right    Full Full       Extraocular Movement      Right Left    Full Full       Neuro/Psych    Oriented x3: Yes   Mood/Affect: Normal       Dilation    Right eye: 1.0% Mydriacyl, 2.5% Phenylephrine @ 10:39 AM        Slit Lamp and Fundus Exam    External Exam      Right Left   External Normal Normal       Slit Lamp Exam      Right Left   Lids/Lashes Normal Normal   Conjunctiva/Sclera White and quiet White and quiet   Cornea Clear Clear   Anterior Chamber Deep and quiet Deep and quiet   Iris Round and reactive Round and reactive   Lens Normal Normal   Anterior Vitreous Normal Normal       Fundus Exam       Right Left   Posterior Vitreous Central vitreous floaters, Posterior vitreous detachment    Disc Normal    C/D Ratio 0.4    Macula Macular thickening, Microaneurysms    Vessels NPDR-Severe    Periphery Normal           IMAGING AND PROCEDURES  Imaging and Procedures for 01/19/20  OCT, Retina - OU - Both Eyes       Right Eye Quality was good. Scan locations included subfoveal. Central Foveal Thickness: 322. Progression has improved.   Left Eye Quality was good. Scan locations included subfoveal. Central Foveal Thickness: 287. Progression has worsened.   Notes OD, juxta foveal CSME, slightly better post injection Avastin.  We will repeat today  OS with increasing CSME inferior to the fovea will need focal laser treatment to decrease treatment burden OS       Intravitreal Injection, Pharmacologic Agent - OD - Right Eye       Time Out 01/19/2020. 11:33 AM. Confirmed correct patient, procedure, site, and patient consented.   Anesthesia Topical anesthesia was used. Anesthetic medications included [place] 3.5%.   Procedure Preparation included Ofloxacin , 5% betadine to ocular surface, 10% betadine to eyelids.   Injection:  1.25 mg Bevacizumab (AVASTIN) SOLN   NDC: [address]-5, Lot: [id number]   Route: Intravitreal, Site: Right Eye, Waste: 0 mg  Post-op Post injection exam found visual acuity of at least counting fingers. The patient tolerated the procedure well. There were no complications. The patient received written and verbal post procedure care education. Post injection medications were not given.                 ASSESSMENT/PLAN:  Severe nonproliferative diabetic retinopathy of right eye, with macular edema, associated with type 2 diabetes mellitus (HCC) OD, follow-up today at 6-week interval with slightly improved juxta foveal CSME will repeat intravitreal OD Avastin.  Severe nonproliferative diabetic retinopathy of left eye, with macular edema, associated  with type 2 diabetes mellitus ([place]) New finding is with extra foveal CSME inferior to the fovea by OCT, will need exam and 4 weeks and focal laser to halt progression      ICD-10-CM  1. Severe nonproliferative diabetic retinopathy of right eye, with macular edema, associated with type 2 diabetes mellitus (HCC)  E11.3411 OCT, Retina - OU - Both Eyes    Intravitreal Injection, Pharmacologic Agent - OD - Right Eye    Bevacizumab (AVASTIN) SOLN 1.25 mg  2. Severe nonproliferative diabetic retinopathy of left eye, with macular edema, associated with type 2 diabetes mellitus (HCC)  Y19.5093 OCT, Retina - OU - Both Eyes    1.  2.  3.  Ophthalmic Meds Ordered this visit:  Meds ordered this encounter  Medications  . Bevacizumab (AVASTIN) SOLN 1.25 mg       Return in about 4 weeks (around 02/16/2020), or ,, And also will need follow-up OD  repeat dilated, Avastin OCT 6 weeks, for OS, dilate, FOCAL.  There are no Patient Instructions on file for this visit.   Explained the diagnoses, plan, and follow up with the patient and they expressed understanding.  Patient expressed understanding of the importance of proper follow up care.   Clent Demark Brand Siever M.D. Diseases & Surgery of the Retina and Vitreous Retina & Diabetic [place] 01/19/20     Abbreviations: M myopia (nearsighted); A astigmatism; H hyperopia (farsighted); P presbyopia; Mrx spectacle prescription;  CTL contact lenses; OD right eye; OS left eye; OU both eyes  XT exotropia; ET esotropia; PEK punctate epithelial keratitis; PEE punctate epithelial erosions; DES dry eye syndrome; MGD meibomian gland dysfunction; ATs artificial tears; PFAT's preservative free artificial tears; [place] nuclear sclerotic cataract; PSC posterior subcapsular cataract; ERM epi-retinal membrane; PVD posterior vitreous detachment; RD retinal detachment; DM diabetes mellitus; DR diabetic retinopathy; NPDR non-proliferative diabetic retinopathy; PDR proliferative  diabetic retinopathy; CSME clinically significant macular edema; DME diabetic macular edema; dbh dot blot hemorrhages; CWS cotton wool spot; POAG primary open angle glaucoma; C/D cup-to-disc ratio; HVF humphrey visual field; GVF goldmann visual field; OCT optical coherence tomography; IOP intraocular pressure; BRVO Branch retinal vein occlusion; CRVO central retinal vein occlusion; CRAO central retinal artery occlusion; BRAO branch retinal artery occlusion; RT retinal tear; SB scleral buckle; PPV pars plana vitrectomy; VH Vitreous hemorrhage; PRP panretinal laser photocoagulation; IVK intravitreal kenalog; VMT vitreomacular traction; MH Macular hole;  NVD neovascularization of the disc; NVE neovascularization elsewhere; AREDS age related eye disease study; ARMD age related macular degeneration; POAG primary open angle glaucoma; EBMD epithelial/anterior basement membrane dystrophy; ACIOL anterior chamber intraocular lens; IOL intraocular lens; PCIOL posterior chamber intraocular lens; Phaco/IOL phacoemulsification with intraocular lens placement; [place] photorefractive keratectomy; LASIK laser assisted in situ keratomileusis; HTN hypertension; DM diabetes mellitus; COPD chronic obstructive pulmonary disease

## 2020-01-19 NOTE — Assessment & Plan Note (Signed)
New finding is with extra foveal CSME inferior to the fovea by OCT, will need exam and 4 weeks and focal laser to halt progression

## 2020-02-15 ENCOUNTER — Ambulatory Visit (INDEPENDENT_AMBULATORY_CARE_PROVIDER_SITE_OTHER): Payer: PPO | Admitting: Ophthalmology

## 2020-02-15 ENCOUNTER — Other Ambulatory Visit: Payer: Self-pay

## 2020-02-15 ENCOUNTER — Encounter (INDEPENDENT_AMBULATORY_CARE_PROVIDER_SITE_OTHER): Payer: Self-pay | Admitting: Ophthalmology

## 2020-02-15 DIAGNOSIS — E113411 Type 2 diabetes mellitus with severe nonproliferative diabetic retinopathy with macular edema, right eye: Secondary | ICD-10-CM

## 2020-02-15 DIAGNOSIS — E113412 Type 2 diabetes mellitus with severe nonproliferative diabetic retinopathy with macular edema, left eye: Secondary | ICD-10-CM

## 2020-02-15 NOTE — Assessment & Plan Note (Signed)
We will continue to monitor the CSME perifoveal and center involved OD, schedule examination March 14, 2020 OD

## 2020-02-15 NOTE — Progress Notes (Signed)
02/15/2020     CHIEF COMPLAINT Patient presents for Retina Follow Up   HISTORY OF PRESENT ILLNESS: Damon Navarro is a 84 y.o. male who presents to the clinic today for:   HPI    Retina Follow Up    Patient presents with  Diabetic Retinopathy.  In left eye.  Severity is severe.  Since onset it is stable.  I, the attending physician,  performed the HPI with the patient and updated documentation appropriately.          Comments    Focal OS. Oct  Pt states vision is about the same. Pt states sometimes vision is better than others [id number]       Last edited by Tilda Franco on 02/15/2020  9:34 AM. (History)      Referring physician: Sinda Du, MD No address on file  HISTORICAL INFORMATION:   Selected notes from the [facility]    Lab Results  Component Value Date   HGBA1C 7.0 (H) 10/07/2015     CURRENT MEDICATIONS: No current outpatient medications on file. (Ophthalmic Drugs)   No current facility-administered medications for this visit. (Ophthalmic Drugs)   Current Outpatient Medications (Other)  Medication Sig  . budesonide-formoterol (SYMBICORT) 160-4.5 MCG/ACT inhaler Inhale 2 puffs into the lungs 2 (two) times daily.  . calcium carbonate (TUMS EX) 750 MG chewable tablet Chew 1 tablet by mouth daily as needed for heartburn.  . magnesium hydroxide (MILK OF MAGNESIA) 400 MG/5ML suspension Take 30 mLs by mouth daily as needed for mild constipation.  . metFORMIN (GLUCOPHAGE) 500 MG tablet Take by mouth 2 (two) times daily with a meal.  . traMADol-acetaminophen (ULTRACET) 37.5-325 MG tablet Take 1 tablet by mouth every 6 (six) hours as needed.  . trimethoprim (TRIMPEX) 100 MG tablet Take 1 tablet (100 mg total) by mouth daily.   No current facility-administered medications for this visit. (Other)      REVIEW OF SYSTEMS:    ALLERGIES Allergies  Allergen Reactions  . Other Other (See Comments)    NO BLOOD PRODUCTS  . Aspirin Hives     PAST MEDICAL HISTORY Past Medical History:  Diagnosis Date  . Asthma   . Bronchitis    last bout 2-3 weeks ago   . COPD (chronic obstructive pulmonary disease) ([place])   . Diabetes mellitus without complication ([place])   . GERD (gastroesophageal reflux disease)   . Hemorrhoids   . Nocturia   . Shortness of breath dyspnea    with congestion only   . Urinary hesitancy    Past Surgical History:  Procedure Laterality Date  . BACK SURGERY    . NOSE SURGERY    . PROSTATECTOMY N/A 10/14/2015   Procedure: PROSTATECTOMY TRANSVESICAL;  Surgeon: Carolan Clines, MD;  Location: [facility];  Service: Urology;  Laterality: N/A;    FAMILY HISTORY History reviewed. No pertinent family history.  SOCIAL HISTORY Social History   Tobacco Use  . Smoking status: Never Smoker  . Smokeless tobacco: Never Used  Substance Use Topics  . Alcohol use: No  . Drug use: No         OPHTHALMIC EXAM: Base Eye Exam    Visual Acuity (Snellen - Linear)      Right Left   Dist [place] 20/40 20/30 -2   Dist ph [place] 20/30 -1 20/25 -1       Tonometry (Tonopen, 9:38 AM)      Right Left   Pressure 15 15  Neuro/Psych    Oriented x3: Yes   Mood/Affect: Normal       Dilation    Left eye: 1.0% Mydriacyl, 2.5% Phenylephrine @ 9:38 AM        Slit Lamp and Fundus Exam    External Exam      Right Left   External Normal Normal       Slit Lamp Exam      Right Left   Lids/Lashes Normal Normal   Conjunctiva/Sclera White and quiet White and quiet   Cornea Clear Clear   Anterior Chamber Deep and quiet Deep and quiet   Iris Round and reactive Round and reactive   Lens 2+ Nuclear sclerosis 1+ Nuclear sclerosis   Anterior Vitreous Normal Normal       Fundus Exam      Right Left   Posterior Vitreous  Posterior vitreous detachment   Disc  Normal   C/D Ratio  0.4   Macula  Mild clinically significant macular edema, Microaneurysms   Vessels   NPDR-Severe   Periphery  Normal          IMAGING  AND PROCEDURES  Imaging and Procedures for 02/15/20  OCT, Retina - OU - Both Eyes       Right Eye Quality was good. Scan locations included subfoveal. Central Foveal Thickness: 322. Progression has worsened.   Left Eye Quality was good. Scan locations included subfoveal. Central Foveal Thickness: 287. Progression has worsened.   Notes CSME OD, juxta foveal and perifoveal will deliver focal laser treatment   OS, with perifoveal CSME will deliver focal laser treatment left eye today       Focal Laser - OS - Left Eye       Time Out Confirmed correct patient, procedure, site, and patient consented.   Anesthesia Topical anesthesia was used. Anesthetic medications included Proparacaine 0.5%.   Laser Information The type of laser was diode. Color was yellow. The duration in seconds was 0.05. The spot size was 100 microns. Laser power was 80. Total spots was 114.   Post-op The patient tolerated the procedure well. There were no complications. The patient received written and verbal post procedure care education.   Notes Focal applied inferior to the fovea OS in a grid like fashion                ASSESSMENT/PLAN:  Severe nonproliferative diabetic retinopathy of left eye, with macular edema, associated with type 2 diabetes mellitus ([place]) Will deliver focal laser treatment to regions of retinal macular thickening inferior to the fovea OS today      ICD-10-CM   1. Severe nonproliferative diabetic retinopathy of left eye, with macular edema, associated with type 2 diabetes mellitus (HCC)  E11.3412 OCT, Retina - OU - Both Eyes    Focal Laser - OS - Left Eye    1.  2.  3.  Ophthalmic Meds Ordered this visit:  No orders of the defined types were placed in this encounter.      Return for dilate, OD, AVASTIN OCT.  There are no Patient Instructions on file for this visit.   Explained the diagnoses, plan, and follow up with the patient and they expressed  understanding.  Patient expressed understanding of the importance of proper follow up care.   Clent Demark Syeda Prickett M.D. Diseases & Surgery of the Retina and Vitreous Retina & Diabetic [place] 02/15/20     Abbreviations: M myopia (nearsighted); A astigmatism; H hyperopia (farsighted); P presbyopia; Mrx spectacle  prescription;  CTL contact lenses; OD right eye; OS left eye; OU both eyes  XT exotropia; ET esotropia; PEK punctate epithelial keratitis; PEE punctate epithelial erosions; DES dry eye syndrome; MGD meibomian gland dysfunction; ATs artificial tears; PFAT's preservative free artificial tears; [place] nuclear sclerotic cataract; PSC posterior subcapsular cataract; ERM epi-retinal membrane; PVD posterior vitreous detachment; RD retinal detachment; DM diabetes mellitus; DR diabetic retinopathy; NPDR non-proliferative diabetic retinopathy; PDR proliferative diabetic retinopathy; CSME clinically significant macular edema; DME diabetic macular edema; dbh dot blot hemorrhages; CWS cotton wool spot; POAG primary open angle glaucoma; C/D cup-to-disc ratio; HVF humphrey visual field; GVF goldmann visual field; OCT optical coherence tomography; IOP intraocular pressure; BRVO Branch retinal vein occlusion; CRVO central retinal vein occlusion; CRAO central retinal artery occlusion; BRAO branch retinal artery occlusion; RT retinal tear; SB scleral buckle; PPV pars plana vitrectomy; VH Vitreous hemorrhage; PRP panretinal laser photocoagulation; IVK intravitreal kenalog; VMT vitreomacular traction; MH Macular hole;  NVD neovascularization of the disc; NVE neovascularization elsewhere; AREDS age related eye disease study; ARMD age related macular degeneration; POAG primary open angle glaucoma; EBMD epithelial/anterior basement membrane dystrophy; ACIOL anterior chamber intraocular lens; IOL intraocular lens; PCIOL posterior chamber intraocular lens; Phaco/IOL phacoemulsification with intraocular lens placement; [place]  photorefractive keratectomy; LASIK laser assisted in situ keratomileusis; HTN hypertension; DM diabetes mellitus; COPD chronic obstructive pulmonary disease

## 2020-02-15 NOTE — Assessment & Plan Note (Signed)
Will deliver focal laser treatment to regions of retinal macular thickening inferior to the fovea OS today

## 2020-03-01 ENCOUNTER — Encounter (INDEPENDENT_AMBULATORY_CARE_PROVIDER_SITE_OTHER): Payer: PPO | Admitting: Ophthalmology

## 2020-03-14 ENCOUNTER — Ambulatory Visit (INDEPENDENT_AMBULATORY_CARE_PROVIDER_SITE_OTHER): Payer: PPO | Admitting: Ophthalmology

## 2020-03-14 ENCOUNTER — Encounter (INDEPENDENT_AMBULATORY_CARE_PROVIDER_SITE_OTHER): Payer: Self-pay | Admitting: Ophthalmology

## 2020-03-14 ENCOUNTER — Other Ambulatory Visit: Payer: Self-pay

## 2020-03-14 DIAGNOSIS — E113411 Type 2 diabetes mellitus with severe nonproliferative diabetic retinopathy with macular edema, right eye: Secondary | ICD-10-CM

## 2020-03-14 MED ORDER — BEVACIZUMAB CHEMO INJECTION 1.25MG/0.05ML SYRINGE FOR KALEIDOSCOPE
1.2500 mg | INTRAVITREAL | Status: AC | PRN
  Administered 2020-03-14: 1.25 mg via INTRAVITREAL

## 2020-03-14 NOTE — Progress Notes (Signed)
Department emergency   03/14/2020     CHIEF COMPLAINT Patient presents for Retina Follow Up   HISTORY OF PRESENT ILLNESS: Damon Navarro is a 84 y.o. male who presents to the clinic today for:   HPI    Retina Follow Up    Patient presents with  Diabetic Retinopathy.  In right eye.  Severity is severe.  Duration of 8 weeks.  Since onset it is stable.  I, the attending physician,  performed the HPI with the patient and updated documentation appropriately.          Comments    8 Week NPDR f\u OD. Possible Avastin OD. OCT  Pt states NVA has decreased. Pt c/o floaters that come and go. BGL: 200's today       Last edited by Tilda Franco on 03/14/2020  9:45 AM. (History)      Referring physician: Sinda Du, MD No address on file  HISTORICAL INFORMATION:   Selected notes from the [facility]    Lab Results  Component Value Date   HGBA1C 7.0 (H) 10/07/2015     CURRENT MEDICATIONS: No current outpatient medications on file. (Ophthalmic Drugs)   No current facility-administered medications for this visit. (Ophthalmic Drugs)   Current Outpatient Medications (Other)  Medication Sig  . budesonide-formoterol (SYMBICORT) 160-4.5 MCG/ACT inhaler Inhale 2 puffs into the lungs 2 (two) times daily.  . calcium carbonate (TUMS EX) 750 MG chewable tablet Chew 1 tablet by mouth daily as needed for heartburn.  . magnesium hydroxide (MILK OF MAGNESIA) 400 MG/5ML suspension Take 30 mLs by mouth daily as needed for mild constipation.  . metFORMIN (GLUCOPHAGE) 500 MG tablet Take by mouth 2 (two) times daily with a meal.  . traMADol-acetaminophen (ULTRACET) 37.5-325 MG tablet Take 1 tablet by mouth every 6 (six) hours as needed.  . trimethoprim (TRIMPEX) 100 MG tablet Take 1 tablet (100 mg total) by mouth daily.   No current facility-administered medications for this visit. (Other)      REVIEW OF SYSTEMS:    ALLERGIES Allergies  Allergen Reactions  . Other Other  (See Comments)    NO BLOOD PRODUCTS  . Aspirin Hives    PAST MEDICAL HISTORY Past Medical History:  Diagnosis Date  . Asthma   . Bronchitis    last bout 2-3 weeks ago   . COPD (chronic obstructive pulmonary disease) ([place])   . Diabetes mellitus without complication ([place])   . GERD (gastroesophageal reflux disease)   . Hemorrhoids   . Nocturia   . Shortness of breath dyspnea    with congestion only   . Urinary hesitancy    Past Surgical History:  Procedure Laterality Date  . BACK SURGERY    . NOSE SURGERY    . PROSTATECTOMY N/A 10/14/2015   Procedure: PROSTATECTOMY TRANSVESICAL;  Surgeon: Carolan Clines, MD;  Location: [facility];  Service: Urology;  Laterality: N/A;    FAMILY HISTORY History reviewed. No pertinent family history.  SOCIAL HISTORY Social History   Tobacco Use  . Smoking status: Never Smoker  . Smokeless tobacco: Never Used  Substance Use Topics  . Alcohol use: No  . Drug use: No         OPHTHALMIC EXAM: Base Eye Exam    Visual Acuity (Snellen - Linear)      Right Left   Dist [place] 20/50 20/40   Dist ph [place] 20/40 20/30 -2       Tonometry (Tonopen, 9:50 AM)  Right Left   Pressure 10 11       Pupils      Pupils Dark Light Shape React APD   Right PERRL 4 4 Round Minimal None   Left PERRL 4 4 Round Sluggish None       Visual Fields (Counting fingers)      Left Right    Full Full       Neuro/Psych    Oriented x3: Yes   Mood/Affect: Normal       Dilation    Right eye: 1.0% Mydriacyl, 2.5% Phenylephrine @ 9:50 AM        Slit Lamp and Fundus Exam    External Exam      Right Left   External Normal Normal       Slit Lamp Exam      Right Left   Lids/Lashes Normal Normal   Conjunctiva/Sclera White and quiet White and quiet   Cornea Clear Clear   Anterior Chamber Deep and quiet Deep and quiet   Iris Round and reactive Round and reactive   Lens Normal Normal   Anterior Vitreous Normal Normal       Fundus Exam      Right  Left   Posterior Vitreous Central vitreous floaters, Posterior vitreous detachment    Disc Normal    C/D Ratio 0.5    Macula Few Microaneurysms, no macular thickening    Vessels NPDR-Severe    Periphery Normal           IMAGING AND PROCEDURES  Imaging and Procedures for 03/14/20  OCT, Retina - OU - Both Eyes       Right Eye Quality was good. Scan locations included subfoveal. Central Foveal Thickness: 322. Progression has been stable.   Left Eye Quality was good. Scan locations included subfoveal. Central Foveal Thickness: 295. Progression has been stable.   Notes OD, at 8-week follow-up for CSME, perifoveal, overall stable with good acuity thus will treat today with intravitreal Avastin and examination again in 8 weeks OD  OS follow-up as scheduled       Intravitreal Injection, Pharmacologic Agent - OD - Right Eye       Time Out 03/14/2020. 11:09 AM. Confirmed correct patient, procedure, site, and patient consented.   Anesthesia Topical anesthesia was used. Anesthetic medications included Akten 3.5%.   Procedure Preparation included Tobramycin 0.3%, 10% betadine to eyelids, 5% betadine to ocular surface. A 30 gauge needle was used.   Injection:  1.25 mg Bevacizumab (AVASTIN) SOLN   NDC: [address]-6, Lot: [id number]   Route: Intravitreal, Site: Right Eye, Waste: 0 mg  Post-op Post injection exam found visual acuity of at least counting fingers. The patient tolerated the procedure well. There were no complications. The patient received written and verbal post procedure care education. Post injection medications were not given.                 ASSESSMENT/PLAN:  Severe nonproliferative diabetic retinopathy of right eye, with macular edema, associated with type 2 diabetes mellitus (HCC)  perifoveal CSME overall stable at 8-week interval.  We will treat again today and examination in 8 weeks      ICD-10-CM   1. Severe nonproliferative diabetic retinopathy of  right eye, with macular edema, associated with type 2 diabetes mellitus (HCC)  E11.3411 OCT, Retina - OU - Both Eyes    Intravitreal Injection, Pharmacologic Agent - OD - Right Eye    Bevacizumab (AVASTIN) SOLN 1.25 mg    1.  Repeat injection OD today of intravitreal Avastin at 8-week interval  2.  Dilate OS as scheduled  3.  Ophthalmic Meds Ordered this visit:  Meds ordered this encounter  Medications  . Bevacizumab (AVASTIN) SOLN 1.25 mg       Return in about 8 weeks (around 05/09/2020) for dilate, OD, AVASTIN OCT.  Patient Instructions  Patient report any new onset visual acuity decline or distortion OD with excellent visual acuity,    Explained the diagnoses, plan, and follow up with the patient and they expressed understanding.  Patient expressed understanding of the importance of proper follow up care.   Damon Navarro M.D. Diseases & Surgery of the Retina and Vitreous Retina & Diabetic [place] 03/14/20     Abbreviations: M myopia (nearsighted); A astigmatism; H hyperopia (farsighted); P presbyopia; Mrx spectacle prescription;  CTL contact lenses; OD right eye; OS left eye; OU both eyes  XT exotropia; ET esotropia; PEK punctate epithelial keratitis; PEE punctate epithelial erosions; DES dry eye syndrome; MGD meibomian gland dysfunction; ATs artificial tears; PFAT's preservative free artificial tears; [place] nuclear sclerotic cataract; PSC posterior subcapsular cataract; ERM epi-retinal membrane; PVD posterior vitreous detachment; RD retinal detachment; DM diabetes mellitus; DR diabetic retinopathy; NPDR non-proliferative diabetic retinopathy; PDR proliferative diabetic retinopathy; CSME clinically significant macular edema; DME diabetic macular edema; dbh dot blot hemorrhages; CWS cotton wool spot; POAG primary open angle glaucoma; C/D cup-to-disc ratio; HVF humphrey visual field; GVF goldmann visual field; OCT optical coherence tomography; IOP intraocular pressure; BRVO  Branch retinal vein occlusion; CRVO central retinal vein occlusion; CRAO central retinal artery occlusion; BRAO branch retinal artery occlusion; RT retinal tear; SB scleral buckle; PPV pars plana vitrectomy; VH Vitreous hemorrhage; PRP panretinal laser photocoagulation; IVK intravitreal kenalog; VMT vitreomacular traction; MH Macular hole;  NVD neovascularization of the disc; NVE neovascularization elsewhere; AREDS age related eye disease study; ARMD age related macular degeneration; POAG primary open angle glaucoma; EBMD epithelial/anterior basement membrane dystrophy; ACIOL anterior chamber intraocular lens; IOL intraocular lens; PCIOL posterior chamber intraocular lens; Phaco/IOL phacoemulsification with intraocular lens placement; [place] photorefractive keratectomy; LASIK laser assisted in situ keratomileusis; HTN hypertension; DM diabetes mellitus; COPD chronic obstructive pulmonary disease

## 2020-03-14 NOTE — Assessment & Plan Note (Signed)
perifoveal CSME overall stable at 8-week interval.  We will treat again today and examination in 8 weeks

## 2020-03-14 NOTE — Patient Instructions (Signed)
Patient report any new onset visual acuity decline or distortion OD with excellent visual acuity,

## 2020-05-09 ENCOUNTER — Other Ambulatory Visit: Payer: Self-pay

## 2020-05-09 ENCOUNTER — Encounter (INDEPENDENT_AMBULATORY_CARE_PROVIDER_SITE_OTHER): Payer: Self-pay | Admitting: Ophthalmology

## 2020-05-09 ENCOUNTER — Ambulatory Visit (INDEPENDENT_AMBULATORY_CARE_PROVIDER_SITE_OTHER): Payer: PPO | Admitting: Ophthalmology

## 2020-05-09 DIAGNOSIS — H2511 Age-related nuclear cataract, right eye: Secondary | ICD-10-CM

## 2020-05-09 DIAGNOSIS — E113411 Type 2 diabetes mellitus with severe nonproliferative diabetic retinopathy with macular edema, right eye: Secondary | ICD-10-CM

## 2020-05-09 DIAGNOSIS — E113412 Type 2 diabetes mellitus with severe nonproliferative diabetic retinopathy with macular edema, left eye: Secondary | ICD-10-CM

## 2020-05-09 MED ORDER — BEVACIZUMAB CHEMO INJECTION 1.25MG/0.05ML SYRINGE FOR KALEIDOSCOPE
1.2500 mg | INTRAVITREAL | Status: AC | PRN
  Administered 2020-05-09: 1.25 mg via INTRAVITREAL

## 2020-05-09 NOTE — Patient Instructions (Signed)
Patient instructed to contact the office promptly for new onset visual acuity decline or distortion 

## 2020-05-09 NOTE — Assessment & Plan Note (Signed)
The nature of cataract was discussed with the patient as well as the elective nature of surgery. The patient was reassured that surgery at a later date does not put the patient at risk for a worse outcome. It was emphasized that the need for surgery is dictated by the patient's quality of life as influenced by the cataract. Patient was instructed to maintain close follow up with their general eye care doctor.  Minor OU at this time

## 2020-05-09 NOTE — Assessment & Plan Note (Signed)
Center involved CSME persistent yet slightly better at 8 weeks, repeat injection today and examination repeat in 8 weeks

## 2020-05-09 NOTE — Assessment & Plan Note (Signed)
OS improved at nearly 3 months post focal laser treatment inferiorly, will continue to monitor and observe

## 2020-05-09 NOTE — Progress Notes (Signed)
05/09/2020     CHIEF COMPLAINT Patient presents for Retina Follow Up   HISTORY OF PRESENT ILLNESS: Damon Navarro is a 84 y.o. male who presents to the clinic today for:   HPI    Retina Follow Up    Patient presents with  Diabetic Retinopathy.  In right eye.  Severity is severe.  Duration of 8 weeks.  Since onset it is stable.  I, the attending physician,  performed the HPI with the patient and updated documentation appropriately.          Comments    8 Week NPDR f\u OD. Possible Avastin OD. OCT  Pt states vision is about the same. Denies any complaints. BGL: 98 Pt will get an updated A1C Dec 2nd       Last edited by Tilda Franco on 05/09/2020 10:22 AM. (History)      Referring physician: Sinda Du, MD No address on file  HISTORICAL INFORMATION:   Selected notes from the [facility]    Lab Results  Component Value Date   HGBA1C 7.0 (H) 10/07/2015     CURRENT MEDICATIONS: No current outpatient medications on file. (Ophthalmic Drugs)   No current facility-administered medications for this visit. (Ophthalmic Drugs)   Current Outpatient Medications (Other)  Medication Sig  . budesonide-formoterol (SYMBICORT) 160-4.5 MCG/ACT inhaler Inhale 2 puffs into the lungs 2 (two) times daily.  . calcium carbonate (TUMS EX) 750 MG chewable tablet Chew 1 tablet by mouth daily as needed for heartburn.  . magnesium hydroxide (MILK OF MAGNESIA) 400 MG/5ML suspension Take 30 mLs by mouth daily as needed for mild constipation.  . metFORMIN (GLUCOPHAGE) 500 MG tablet Take by mouth 2 (two) times daily with a meal.  . traMADol-acetaminophen (ULTRACET) 37.5-325 MG tablet Take 1 tablet by mouth every 6 (six) hours as needed.  . trimethoprim (TRIMPEX) 100 MG tablet Take 1 tablet (100 mg total) by mouth daily.   No current facility-administered medications for this visit. (Other)      REVIEW OF SYSTEMS: ROS    Positive for: Endocrine   Last edited by Tilda Franco on 05/09/2020 10:22 AM. (History)       ALLERGIES Allergies  Allergen Reactions  . Other Other (See Comments)    NO BLOOD PRODUCTS  . Aspirin Hives    PAST MEDICAL HISTORY Past Medical History:  Diagnosis Date  . Asthma   . Bronchitis    last bout 2-3 weeks ago   . COPD (chronic obstructive pulmonary disease) ([place])   . Diabetes mellitus without complication ([place])   . GERD (gastroesophageal reflux disease)   . Hemorrhoids   . Nocturia   . Shortness of breath dyspnea    with congestion only   . Urinary hesitancy    Past Surgical History:  Procedure Laterality Date  . BACK SURGERY    . NOSE SURGERY    . PROSTATECTOMY N/A 10/14/2015   Procedure: PROSTATECTOMY TRANSVESICAL;  Surgeon: Carolan Clines, MD;  Location: [facility];  Service: Urology;  Laterality: N/A;    FAMILY HISTORY History reviewed. No pertinent family history.  SOCIAL HISTORY Social History   Tobacco Use  . Smoking status: Never Smoker  . Smokeless tobacco: Never Used  Substance Use Topics  . Alcohol use: No  . Drug use: No         OPHTHALMIC EXAM:  Base Eye Exam    Visual Acuity (Snellen - Linear)      Right Left  Dist [place] 20/50 -2 20/50 -2   Dist ph  20/40 + 30-2       Tonometry (Tonopen, 10:26 AM)      Right Left   Pressure 16 14       Pupils      Pupils Dark Light Shape React APD   Right PERRL 5 4 Round Sluggish None   Left PERRL 4 4 Round Minimal None       Visual Fields (Counting fingers)      Left Right    Full Full       Neuro/Psych    Oriented x3: Yes   Mood/Affect: Normal       Dilation    Right eye: 1.0% Mydriacyl, 2.5% Phenylephrine @ 10:26 AM        Slit Lamp and Fundus Exam    External Exam      Right Left   External Normal Normal       Slit Lamp Exam      Right Left   Lids/Lashes Normal Normal   Conjunctiva/Sclera White and quiet White and quiet   Cornea Clear Clear   Anterior Chamber Deep and quiet Deep and quiet   Iris Round and  reactive Round and reactive   Lens 2+ Nuclear sclerosis 1+ Nuclear sclerosis   Anterior Vitreous Normal Normal       Fundus Exam      Right Left   Posterior Vitreous Central vitreous floaters, Posterior vitreous detachment    Disc Normal    C/D Ratio 0.5    Macula Few Microaneurysms, no clinically detectable macular thickening    Vessels NPDR-Severe    Periphery Normal           IMAGING AND PROCEDURES  Imaging and Procedures for 05/09/20  OCT, Retina - OU - Both Eyes       Right Eye Quality was good. Scan locations included subfoveal. Central Foveal Thickness: 318. Progression has been stable. Findings include abnormal foveal contour.   Left Eye Quality was good. Scan locations included subfoveal. Central Foveal Thickness: 305. Progression has improved. Findings include abnormal foveal contour.   Notes Minor CSME OD persists in its center involved thus will continue to treat  With intravitreal Avastin, currently at 8-week interval to prevent progression.       Intravitreal Injection, Pharmacologic Agent - OD - Right Eye       Time Out 05/09/2020. 10:53 AM. Confirmed correct patient, procedure, site, and patient consented.   Anesthesia Topical anesthesia was used. Anesthetic medications included Akten 3.5%.   Procedure Preparation included Tobramycin 0.3%, 10% betadine to eyelids, 5% betadine to ocular surface. A 30 gauge needle was used.   Injection:  1.25 mg Bevacizumab (AVASTIN) SOLN   NDC: 70360-001-02, Lot: [id number]   Route: Intravitreal, Site: Right Eye, Waste: 0 mg  Post-op Post injection exam found visual acuity of at least counting fingers. The patient tolerated the procedure well. There were no complications. The patient received written and verbal post procedure care education. Post injection medications were not given.                 ASSESSMENT/PLAN:  Severe nonproliferative diabetic retinopathy of right eye, with macular edema, associated  with type 2 diabetes mellitus ([place]) Center involved CSME persistent yet slightly better at 8 weeks, repeat injection today and examination repeat in 8 weeks  Severe nonproliferative diabetic retinopathy of left eye, with macular edema, associated with type 2 diabetes mellitus ([place]) OS improved at nearly  3 months post focal laser treatment inferiorly, will continue to monitor and observe  Nuclear sclerotic cataract of right eye The nature of cataract was discussed with the patient as well as the elective nature of surgery. The patient was reassured that surgery at a later date does not put the patient at risk for a worse outcome. It was emphasized that the need for surgery is dictated by the patient's quality of life as influenced by the cataract. Patient was instructed to maintain close follow up with their general eye care doctor.  Minor OU at this time      ICD-10-CM   1. Severe nonproliferative diabetic retinopathy of right eye, with macular edema, associated with type 2 diabetes mellitus (HCC)  E11.3411 OCT, Retina - OU - Both Eyes    Intravitreal Injection, Pharmacologic Agent - OD - Right Eye    Bevacizumab (AVASTIN) SOLN 1.25 mg  2. Severe nonproliferative diabetic retinopathy of left eye, with macular edema, associated with type 2 diabetes mellitus ([place])  J88.4166   3. Nuclear sclerotic cataract of right eye  H25.11     1.  CSME OS has stabilized, status post focal for noncenter involved CSME    2.  OD, CSME improved with center involved CSME, at 8-week interval, repeat injection today  3.  Dilate OU next, likely injection Avastin OD  Ophthalmic Meds Ordered this visit:  Meds ordered this encounter  Medications  . Bevacizumab (AVASTIN) SOLN 1.25 mg       Return in about 8 weeks (around 07/04/2020) for DILATE OU, AVASTIN OCT, OD.  Patient Instructions  Patient instructed to contact the office promptly for new onset visual acuity decline or distortion    Explained the  diagnoses, plan, and follow up with the patient and they expressed understanding.  Patient expressed understanding of the importance of proper follow up care.   Clent Demark Claus Silvestro M.D. Diseases & Surgery of the Retina and Vitreous Retina & Diabetic [place] 05/09/20     Abbreviations: M myopia (nearsighted); A astigmatism; H hyperopia (farsighted); P presbyopia; Mrx spectacle prescription;  CTL contact lenses; OD right eye; OS left eye; OU both eyes  XT exotropia; ET esotropia; PEK punctate epithelial keratitis; PEE punctate epithelial erosions; DES dry eye syndrome; MGD meibomian gland dysfunction; ATs artificial tears; PFAT's preservative free artificial tears; [place] nuclear sclerotic cataract; PSC posterior subcapsular cataract; ERM epi-retinal membrane; PVD posterior vitreous detachment; RD retinal detachment; DM diabetes mellitus; DR diabetic retinopathy; NPDR non-proliferative diabetic retinopathy; PDR proliferative diabetic retinopathy; CSME clinically significant macular edema; DME diabetic macular edema; dbh dot blot hemorrhages; CWS cotton wool spot; POAG primary open angle glaucoma; C/D cup-to-disc ratio; HVF humphrey visual field; GVF goldmann visual field; OCT optical coherence tomography; IOP intraocular pressure; BRVO Branch retinal vein occlusion; CRVO central retinal vein occlusion; CRAO central retinal artery occlusion; BRAO branch retinal artery occlusion; RT retinal tear; SB scleral buckle; PPV pars plana vitrectomy; VH Vitreous hemorrhage; PRP panretinal laser photocoagulation; IVK intravitreal kenalog; VMT vitreomacular traction; MH Macular hole;  NVD neovascularization of the disc; NVE neovascularization elsewhere; AREDS age related eye disease study; ARMD age related macular degeneration; POAG primary open angle glaucoma; EBMD epithelial/anterior basement membrane dystrophy; ACIOL anterior chamber intraocular lens; IOL intraocular lens; PCIOL posterior chamber intraocular lens;  Phaco/IOL phacoemulsification with intraocular lens placement; [place] photorefractive keratectomy; LASIK laser assisted in situ keratomileusis; HTN hypertension; DM diabetes mellitus; COPD chronic obstructive pulmonary disease

## 2020-06-03 DIAGNOSIS — E11319 Type 2 diabetes mellitus with unspecified diabetic retinopathy without macular edema: Secondary | ICD-10-CM | POA: Diagnosis not present

## 2020-06-03 DIAGNOSIS — Z13220 Encounter for screening for lipoid disorders: Secondary | ICD-10-CM | POA: Diagnosis not present

## 2020-06-03 DIAGNOSIS — K219 Gastro-esophageal reflux disease without esophagitis: Secondary | ICD-10-CM | POA: Diagnosis not present

## 2020-06-03 DIAGNOSIS — E1165 Type 2 diabetes mellitus with hyperglycemia: Secondary | ICD-10-CM | POA: Diagnosis not present

## 2020-06-08 DIAGNOSIS — J302 Other seasonal allergic rhinitis: Secondary | ICD-10-CM | POA: Diagnosis not present

## 2020-06-08 DIAGNOSIS — Z6821 Body mass index (BMI) 21.0-21.9, adult: Secondary | ICD-10-CM | POA: Diagnosis not present

## 2020-06-08 DIAGNOSIS — E11319 Type 2 diabetes mellitus with unspecified diabetic retinopathy without macular edema: Secondary | ICD-10-CM | POA: Diagnosis not present

## 2020-06-08 DIAGNOSIS — E1165 Type 2 diabetes mellitus with hyperglycemia: Secondary | ICD-10-CM | POA: Diagnosis not present

## 2020-06-13 DIAGNOSIS — Z23 Encounter for immunization: Secondary | ICD-10-CM | POA: Diagnosis not present

## 2020-07-04 ENCOUNTER — Other Ambulatory Visit: Payer: Self-pay

## 2020-07-04 ENCOUNTER — Ambulatory Visit (INDEPENDENT_AMBULATORY_CARE_PROVIDER_SITE_OTHER): Payer: PPO | Admitting: Ophthalmology

## 2020-07-04 ENCOUNTER — Encounter (INDEPENDENT_AMBULATORY_CARE_PROVIDER_SITE_OTHER): Payer: Self-pay | Admitting: Ophthalmology

## 2020-07-04 DIAGNOSIS — E113412 Type 2 diabetes mellitus with severe nonproliferative diabetic retinopathy with macular edema, left eye: Secondary | ICD-10-CM

## 2020-07-04 DIAGNOSIS — E113411 Type 2 diabetes mellitus with severe nonproliferative diabetic retinopathy with macular edema, right eye: Secondary | ICD-10-CM

## 2020-07-04 MED ORDER — BEVACIZUMAB 2.5 MG/0.1ML IZ SOSY
2.5000 mg | PREFILLED_SYRINGE | INTRAVITREAL | Status: AC | PRN
  Administered 2020-07-04: 2.5 mg via INTRAVITREAL

## 2020-07-04 NOTE — Assessment & Plan Note (Signed)
Vastly improving CSME OD currently at 8-week follow-up post intravitreal Avastin, will repeat injection today and examination OD in 10 weeks possible injection but may be nearing the end of the treatment course for the time being OD

## 2020-07-04 NOTE — Progress Notes (Signed)
07/04/2020     CHIEF COMPLAINT Patient presents for Retina Follow Up (8 Week NPDR f\u OD. Possible Avastin OD. OCT/PT states vision is stable. Denies changes./BGL: did not check/A1C:7)   HISTORY OF PRESENT ILLNESS: Damon Navarro is a 85 y.o. male who presents to the clinic today for:   HPI    Retina Follow Up    Patient presents with  Diabetic Retinopathy.  In right eye.  Severity is moderate.  Duration of 8 weeks.  Since onset it is stable.  I, the attending physician,  performed the HPI with the patient and updated documentation appropriately. Additional comments: 8 Week NPDR f\u OD. Possible Avastin OD. OCT PT states vision is stable. Denies changes. BGL: did not check A1C:7       Last edited by Tilda Franco on 07/04/2020  9:58 AM. (History)      Referring physician: Sinda Du, MD No address on file  HISTORICAL INFORMATION:   Selected notes from the [facility]    Lab Results  Component Value Date   HGBA1C 7.0 (H) 10/07/2015     CURRENT MEDICATIONS: No current outpatient medications on file. (Ophthalmic Drugs)   No current facility-administered medications for this visit. (Ophthalmic Drugs)   Current Outpatient Medications (Other)  Medication Sig  . budesonide-formoterol (SYMBICORT) 160-4.5 MCG/ACT inhaler Inhale 2 puffs into the lungs 2 (two) times daily.  . calcium carbonate (TUMS EX) 750 MG chewable tablet Chew 1 tablet by mouth daily as needed for heartburn.  . magnesium hydroxide (MILK OF MAGNESIA) 400 MG/5ML suspension Take 30 mLs by mouth daily as needed for mild constipation.  . metFORMIN (GLUCOPHAGE) 500 MG tablet Take by mouth 2 (two) times daily with a meal.  . traMADol-acetaminophen (ULTRACET) 37.5-325 MG tablet Take 1 tablet by mouth every 6 (six) hours as needed.  . trimethoprim (TRIMPEX) 100 MG tablet Take 1 tablet (100 mg total) by mouth daily.   No current facility-administered medications for this visit. (Other)       REVIEW OF SYSTEMS: ROS    Positive for: Endocrine   Last edited by Tilda Franco on 07/04/2020  9:58 AM. (History)       ALLERGIES Allergies  Allergen Reactions  . Other Other (See Comments)    NO BLOOD PRODUCTS  . Aspirin Hives    PAST MEDICAL HISTORY Past Medical History:  Diagnosis Date  . Asthma   . Bronchitis    last bout 2-3 weeks ago   . COPD (chronic obstructive pulmonary disease) ([place])   . Diabetes mellitus without complication ([place])   . GERD (gastroesophageal reflux disease)   . Hemorrhoids   . Nocturia   . Shortness of breath dyspnea    with congestion only   . Urinary hesitancy    Past Surgical History:  Procedure Laterality Date  . BACK SURGERY    . NOSE SURGERY    . PROSTATECTOMY N/A 10/14/2015   Procedure: PROSTATECTOMY TRANSVESICAL;  Surgeon: Carolan Clines, MD;  Location: [facility];  Service: Urology;  Laterality: N/A;    FAMILY HISTORY History reviewed. No pertinent family history.  SOCIAL HISTORY Social History   Tobacco Use  . Smoking status: Never Smoker  . Smokeless tobacco: Never Used  Substance Use Topics  . Alcohol use: No  . Drug use: No         OPHTHALMIC EXAM:  Base Eye Exam    Visual Acuity (Snellen - Linear)      Right Left  Dist [place] 20/40 -1 20/40 -2   Dist ph [place] 20/40 + 20/30 -2       Tonometry (Tonopen, 10:03 AM)      Right Left   Pressure 16 15       Pupils      Pupils Dark Light Shape React APD   Right PERRL 4 4 Round Minimal None   Left PERRL 4 4 Round Minimal None       Visual Fields (Counting fingers)      Left Right    Full Full       Neuro/Psych    Oriented x3: Yes   Mood/Affect: Normal       Dilation    Right eye: 1.0% Mydriacyl, 2.5% Phenylephrine @ 10:03 AM        Slit Lamp and Fundus Exam    External Exam      Right Left   External Normal Normal       Slit Lamp Exam      Right Left   Lids/Lashes Normal Normal   Conjunctiva/Sclera White and quiet White and quiet    Cornea Clear Clear   Anterior Chamber Deep and quiet Deep and quiet   Iris Round and reactive Round and reactive   Lens 2+ Nuclear sclerosis 1+ Nuclear sclerosis   Anterior Vitreous Normal Normal       Fundus Exam      Right Left   Posterior Vitreous Central vitreous floaters, Posterior vitreous detachment    Disc Normal    C/D Ratio 0.5    Macula Few Microaneurysms, no clinically detectable macular thickening    Vessels NPDR-Severe    Periphery Normal           IMAGING AND PROCEDURES  Imaging and Procedures for 07/04/20  OCT, Retina - OU - Both Eyes       Right Eye Quality was good. Scan locations included subfoveal. Central Foveal Thickness: 303. Progression has improved. Findings include abnormal foveal contour, cystoid macular edema.   Left Eye Quality was good. Scan locations included subfoveal. Central Foveal Thickness: 348. Progression has worsened. Findings include abnormal foveal contour, cystoid macular edema.   Notes OD with much improved CSME, focal perifoveal, currently at 8-week follow-up  OD S with increasing CSME temporal as well as inferior to the fovea, with center involvement will need reevaluation in the coming week or so for consideration of therapy       Intravitreal Injection, Pharmacologic Agent - OD - Right Eye       Time Out 07/04/2020. 10:32 AM. Confirmed correct patient, procedure, site, and patient consented.   Anesthesia Topical anesthesia was used. Anesthetic medications included Akten 3.5%.   Procedure Preparation included Tobramycin 0.3%, 10% betadine to eyelids, 5% betadine to ocular surface. A 30 gauge needle was used.   Injection:  2.5 mg Bevacizumab (AVASTIN) 2.5mg /0.2mL SOSY   NDC: M6102387, [id number]   Route: Intravitreal, Site: Right Eye  Post-op Post injection exam found visual acuity of at least counting fingers. The patient tolerated the procedure well. There were no complications. The patient received  written and verbal post procedure care education. Post injection medications were not given.                 ASSESSMENT/PLAN:  Severe nonproliferative diabetic retinopathy of right eye, with macular edema, associated with type 2 diabetes mellitus (HCC) Vastly improving CSME OD currently at 8-week follow-up post intravitreal Avastin, will repeat injection today and examination OD in 10  weeks possible injection but may be nearing the end of the treatment course for the time being OD  Severe nonproliferative diabetic retinopathy of left eye, with macular edema, associated with type 2 diabetes mellitus (HCC) CSME OS however continues to decrease documented by OCT, will need follow-up examination in the coming weeks OS      ICD-10-CM   1. Severe nonproliferative diabetic retinopathy of right eye, with macular edema, associated with type 2 diabetes mellitus (HCC)  E11.3411 OCT, Retina - OU - Both Eyes    Intravitreal Injection, Pharmacologic Agent - OD - Right Eye    bevacizumab (AVASTIN) SOSY 2.5 mg  2. Severe nonproliferative diabetic retinopathy of left eye, with macular edema, associated with type 2 diabetes mellitus (HCC)  K09.3818     1.  2.  3.  Ophthalmic Meds Ordered this visit:  Meds ordered this encounter  Medications  . bevacizumab (AVASTIN) SOSY 2.5 mg       Return in about 1 week (around 07/11/2020) for dilate, OS, AVASTIN OCT.  There are no Patient Instructions on file for this visit.   Explained the diagnoses, plan, and follow up with the patient and they expressed understanding.  Patient expressed understanding of the importance of proper follow up care.   Alford Highland Torri Michalski M.D. Diseases & Surgery of the Retina and Vitreous [facility] 07/04/20     Abbreviations: M myopia (nearsighted); A astigmatism; H hyperopia (farsighted); P presbyopia; Mrx spectacle prescription;  CTL contact lenses; OD right eye; OS left eye; OU both eyes  XT  exotropia; ET esotropia; PEK punctate epithelial keratitis; PEE punctate epithelial erosions; DES dry eye syndrome; MGD meibomian gland dysfunction; ATs artificial tears; PFAT's preservative free artificial tears; NSC nuclear sclerotic cataract; PSC posterior subcapsular cataract; ERM epi-retinal membrane; PVD posterior vitreous detachment; RD retinal detachment; DM diabetes mellitus; DR diabetic retinopathy; NPDR non-proliferative diabetic retinopathy; PDR proliferative diabetic retinopathy; CSME clinically significant macular edema; DME diabetic macular edema; dbh dot blot hemorrhages; CWS cotton wool spot; POAG primary open angle glaucoma; C/D cup-to-disc ratio; HVF humphrey visual field; GVF goldmann visual field; OCT optical coherence tomography; IOP intraocular pressure; BRVO Branch retinal vein occlusion; CRVO central retinal vein occlusion; CRAO central retinal artery occlusion; BRAO branch retinal artery occlusion; RT retinal tear; SB scleral buckle; PPV pars plana vitrectomy; VH Vitreous hemorrhage; PRP panretinal laser photocoagulation; IVK intravitreal kenalog; VMT vitreomacular traction; MH Macular hole;  NVD neovascularization of the disc; NVE neovascularization elsewhere; AREDS age related eye disease study; ARMD age related macular degeneration; POAG primary open angle glaucoma; EBMD epithelial/anterior basement membrane dystrophy; ACIOL anterior chamber intraocular lens; IOL intraocular lens; PCIOL posterior chamber intraocular lens; Phaco/IOL phacoemulsification with intraocular lens placement; PRK photorefractive keratectomy; LASIK laser assisted in situ keratomileusis; HTN hypertension; DM diabetes mellitus; COPD chronic obstructive pulmonary disease

## 2020-07-04 NOTE — Assessment & Plan Note (Signed)
CSME OS however continues to decrease documented by OCT, will need follow-up examination in the coming weeks OS

## 2020-07-11 ENCOUNTER — Encounter (INDEPENDENT_AMBULATORY_CARE_PROVIDER_SITE_OTHER): Payer: Self-pay | Admitting: Ophthalmology

## 2020-07-11 ENCOUNTER — Ambulatory Visit (INDEPENDENT_AMBULATORY_CARE_PROVIDER_SITE_OTHER): Payer: PPO | Admitting: Ophthalmology

## 2020-07-11 ENCOUNTER — Other Ambulatory Visit: Payer: Self-pay

## 2020-07-11 DIAGNOSIS — E113412 Type 2 diabetes mellitus with severe nonproliferative diabetic retinopathy with macular edema, left eye: Secondary | ICD-10-CM | POA: Diagnosis not present

## 2020-07-11 DIAGNOSIS — E113411 Type 2 diabetes mellitus with severe nonproliferative diabetic retinopathy with macular edema, right eye: Secondary | ICD-10-CM

## 2020-07-11 MED ORDER — BEVACIZUMAB 2.5 MG/0.1ML IZ SOSY
2.5000 mg | PREFILLED_SYRINGE | INTRAVITREAL | Status: AC | PRN
  Administered 2020-07-11: 2.5 mg via INTRAVITREAL

## 2020-07-11 NOTE — Progress Notes (Signed)
07/11/2020     CHIEF COMPLAINT Patient presents for Retina Follow Up (5 MO FU OS, POSS AVASTIN OS////Pt reports stable vision OU, no new F/F OU, no pain or pressure OU. ////Last A1C: 7  06/2020////Last BS: 116 this AM )   HISTORY OF PRESENT ILLNESS: Damon Navarro is a 85 y.o. male who presents to the clinic today for:   HPI    Retina Follow Up    Patient presents with  Diabetic Retinopathy.  In left eye.  This started 5 months ago.  Duration of 5 months.  Since onset it is stable. Additional comments: 5 MO FU OS, POSS AVASTIN OS    Pt reports stable vision OU, no new F/F OU, no pain or pressure OU.     Last A1C: 7  06/2020    Last BS: 116 this AM        Last edited by Nichola Sizer D on 07/11/2020  9:25 AM. (History)      Referring physician: Sinda Du, MD No address on file  HISTORICAL INFORMATION:   Selected notes from the [facility]    Lab Results  Component Value Date   HGBA1C 7.0 (H) 10/07/2015     CURRENT MEDICATIONS: No current outpatient medications on file. (Ophthalmic Drugs)   No current facility-administered medications for this visit. (Ophthalmic Drugs)   Current Outpatient Medications (Other)  Medication Sig  . budesonide-formoterol (SYMBICORT) 160-4.5 MCG/ACT inhaler Inhale 2 puffs into the lungs 2 (two) times daily.  . calcium carbonate (TUMS EX) 750 MG chewable tablet Chew 1 tablet by mouth daily as needed for heartburn.  . magnesium hydroxide (MILK OF MAGNESIA) 400 MG/5ML suspension Take 30 mLs by mouth daily as needed for mild constipation.  . metFORMIN (GLUCOPHAGE) 500 MG tablet Take by mouth 2 (two) times daily with a meal.  . traMADol-acetaminophen (ULTRACET) 37.5-325 MG tablet Take 1 tablet by mouth every 6 (six) hours as needed.  . trimethoprim (TRIMPEX) 100 MG tablet Take 1 tablet (100 mg total) by mouth daily.   No current facility-administered medications for this visit. (Other)      REVIEW OF  SYSTEMS:    ALLERGIES Allergies  Allergen Reactions  . Other Other (See Comments)    NO BLOOD PRODUCTS  . Aspirin Hives    PAST MEDICAL HISTORY Past Medical History:  Diagnosis Date  . Asthma   . Bronchitis    last bout 2-3 weeks ago   . COPD (chronic obstructive pulmonary disease) ([place])   . Diabetes mellitus without complication ([place])   . GERD (gastroesophageal reflux disease)   . Hemorrhoids   . Nocturia   . Shortness of breath dyspnea    with congestion only   . Urinary hesitancy    Past Surgical History:  Procedure Laterality Date  . BACK SURGERY    . NOSE SURGERY    . PROSTATECTOMY N/A 10/14/2015   Procedure: PROSTATECTOMY TRANSVESICAL;  Surgeon: Carolan Clines, MD;  Location: [facility];  Service: Urology;  Laterality: N/A;    FAMILY HISTORY History reviewed. No pertinent family history.  SOCIAL HISTORY Social History   Tobacco Use  . Smoking status: Never Smoker  . Smokeless tobacco: Never Used  Substance Use Topics  . Alcohol use: No  . Drug use: No         OPHTHALMIC EXAM: Base Eye Exam    Visual Acuity (ETDRS)      Right Left   Dist [place] 20/40 -1 20/30 -2  Dist ph [place] 20/30 -2 20/25 -1       Tonometry (Tonopen, 9:31 AM)      Right Left   Pressure 13 10       Pupils      Pupils Dark Light Shape React APD   Right PERRL 4 4 Round Minimal None   Left PERRL 4 4 Round Minimal None       Visual Fields (Counting fingers)      Left Right    Full Full       Extraocular Movement      Right Left    Full Full       Neuro/Psych    Oriented x3: Yes   Mood/Affect: Normal       Dilation    Left eye: 1.0% Mydriacyl, 2.5% Phenylephrine @ 9:31 AM        Slit Lamp and Fundus Exam    External Exam      Right Left   External Normal Normal       Slit Lamp Exam      Right Left   Lids/Lashes Normal Normal   Conjunctiva/Sclera White and quiet White and quiet   Cornea Clear Clear   Anterior Chamber Deep and quiet Deep and quiet   Iris  Round and reactive Round and reactive   Lens 2+ Nuclear sclerosis 1+ Nuclear sclerosis   Anterior Vitreous Normal Normal       Fundus Exam      Right Left   Posterior Vitreous  Posterior vitreous detachment   Disc  Normal   C/D Ratio  0.4   Macula  Mild clinically significant macular edema, Microaneurysms   Vessels   NPDR-Severe   Periphery  Normal          IMAGING AND PROCEDURES  Imaging and Procedures for 07/11/20  OCT, Retina - OU - Both Eyes       Right Eye Quality was good. Scan locations included subfoveal. Central Foveal Thickness: 302. Progression has improved. Findings include abnormal foveal contour, cystoid macular edema.   Left Eye Quality was good. Scan locations included subfoveal. Central Foveal Thickness: 325. Progression has worsened. Findings include abnormal foveal contour, cystoid macular edema.   Notes OD with much improved CSME, focal perifoveal, currently at 8-week follow-up, now 1 week postinjection  OS with increasing CSME temporal as well as inferior to the fovea, with center involvement will need reevaluation in the coming week or so for consideration of therapy, needs intravitreal Avastin OS today       Intravitreal Injection, Pharmacologic Agent - OS - Left Eye       Time Out 07/11/2020. 9:53 AM. Confirmed correct patient, procedure, site, and patient consented.   Anesthesia Topical anesthesia was used. Anesthetic medications included Akten 3.5%.   Procedure Preparation included Ofloxacin , 10% betadine to eyelids, 5% betadine to ocular surface. A 30 gauge needle was used.   Injection:  2.5 mg Bevacizumab (AVASTIN) 2.5mg /0.26mL SOSY   NDC: M6102387, [id number]   Route: Intravitreal, Site: Left Eye  Post-op Post injection exam found visual acuity of at least counting fingers. The patient tolerated the procedure well. There were no complications. The patient received written and verbal post procedure care education. Post  injection medications were not given.                 ASSESSMENT/PLAN:  Severe nonproliferative diabetic retinopathy of left eye, with macular edema, associated with type 2 diabetes mellitus ([place])  The nature  of diabetic macular edema was discussed with the patient. Treatment options were outlined including medical therapy, laser & vitrectomy. The use of injectable medications reviewed, including Avastin, Lucentis, and Eylea. Periodic injections into the eye are likely to resolve diabetic macular edema (swelling in the center of vision). Initially, injections are delivered are delivered every 4-6 weeks, and the interval extended as the condition improves. On average, 8-9 injections the first year, and 5 in year 2. Improvement in the condition most often improves on medical therapy. Occasional use of focal laser is also recommended for residual macular edema (swelling). Excellent control of blood glucose and blood pressure are encouraged under the care of a primary physician or endocrinologist. Similarly, attempts to maintain serum cholesterol, low density lipoproteins, and high-density lipoproteins in a favorable range were recommended.   OS, residual CSME persist, commence with intravitreal Avastin use today.  Severe nonproliferative diabetic retinopathy of right eye, with macular edema, associated with type 2 diabetes mellitus ([place]) Overall improved and today at 1 week postinjection stable      ICD-10-CM   1. Severe nonproliferative diabetic retinopathy of left eye, with macular edema, associated with type 2 diabetes mellitus (HCC)  Q25.9563 OCT, Retina - OU - Both Eyes    Intravitreal Injection, Pharmacologic Agent - OS - Left Eye    bevacizumab (AVASTIN) SOSY 2.5 mg  2. Severe nonproliferative diabetic retinopathy of right eye, with macular edema, associated with type 2 diabetes mellitus ([place])  E11.3411     1.  OD continues its improvement on intravitreal Avastin, today 1 week post  therapy improved, follow-up as scheduled OD   2. last CSME worsening despite history of focal laser treatment, will commence with intravitreal Avastin OS today and follow-up OS in 5 weeks  3.  Ophthalmic Meds Ordered this visit:  Meds ordered this encounter  Medications  . bevacizumab (AVASTIN) SOSY 2.5 mg       Return in about 5 weeks (around 08/15/2020) for dilate, OS, AVASTIN OCT.  There are no Patient Instructions on file for this visit.   Explained the diagnoses, plan, and follow up with the patient and they expressed understanding.  Patient expressed understanding of the importance of proper follow up care.   Clent Demark Britiney Blahnik M.D. Diseases & Surgery of the Retina and Vitreous [facility] 07/11/20     Abbreviations: M myopia (nearsighted); A astigmatism; H hyperopia (farsighted); P presbyopia; Mrx spectacle prescription;  CTL contact lenses; OD right eye; OS left eye; OU both eyes  XT exotropia; ET esotropia; PEK punctate epithelial keratitis; PEE punctate epithelial erosions; DES dry eye syndrome; MGD meibomian gland dysfunction; ATs artificial tears; PFAT's preservative free artificial tears; [place] nuclear sclerotic cataract; PSC posterior subcapsular cataract; ERM epi-retinal membrane; PVD posterior vitreous detachment; RD retinal detachment; DM diabetes mellitus; DR diabetic retinopathy; NPDR non-proliferative diabetic retinopathy; PDR proliferative diabetic retinopathy; CSME clinically significant macular edema; DME diabetic macular edema; dbh dot blot hemorrhages; CWS cotton wool spot; POAG primary open angle glaucoma; C/D cup-to-disc ratio; HVF humphrey visual field; GVF goldmann visual field; OCT optical coherence tomography; IOP intraocular pressure; BRVO Branch retinal vein occlusion; CRVO central retinal vein occlusion; CRAO central retinal artery occlusion; BRAO branch retinal artery occlusion; RT retinal tear; SB scleral buckle; PPV pars plana vitrectomy;  VH Vitreous hemorrhage; PRP panretinal laser photocoagulation; IVK intravitreal kenalog; VMT vitreomacular traction; MH Macular hole;  NVD neovascularization of the disc; NVE neovascularization elsewhere; AREDS age related eye disease study; ARMD age related macular degeneration; POAG  primary open angle glaucoma; EBMD epithelial/anterior basement membrane dystrophy; ACIOL anterior chamber intraocular lens; IOL intraocular lens; PCIOL posterior chamber intraocular lens; Phaco/IOL phacoemulsification with intraocular lens placement; [place] photorefractive keratectomy; LASIK laser assisted in situ keratomileusis; HTN hypertension; DM diabetes mellitus; COPD chronic obstructive pulmonary disease

## 2020-07-11 NOTE — Assessment & Plan Note (Signed)
Overall improved and today at 1 week postinjection stable

## 2020-07-11 NOTE — Assessment & Plan Note (Signed)
The nature of diabetic macular edema was discussed with the patient. Treatment options were outlined including medical therapy, laser & vitrectomy. The use of injectable medications reviewed, including Avastin, Lucentis, and Eylea. Periodic injections into the eye are likely to resolve diabetic macular edema (swelling in the center of vision). Initially, injections are delivered are delivered every 4-6 weeks, and the interval extended as the condition improves. On average, 8-9 injections the first year, and 5 in year 2. Improvement in the condition most often improves on medical therapy. Occasional use of focal laser is also recommended for residual macular edema (swelling). Excellent control of blood glucose and blood pressure are encouraged under the care of a primary physician or endocrinologist. Similarly, attempts to maintain serum cholesterol, low density lipoproteins, and high-density lipoproteins in a favorable range were recommended.   OS, residual CSME persist, commence with intravitreal Avastin use today.

## 2020-08-15 ENCOUNTER — Encounter (INDEPENDENT_AMBULATORY_CARE_PROVIDER_SITE_OTHER): Payer: Self-pay | Admitting: Ophthalmology

## 2020-08-15 ENCOUNTER — Ambulatory Visit (INDEPENDENT_AMBULATORY_CARE_PROVIDER_SITE_OTHER): Payer: PPO | Admitting: Ophthalmology

## 2020-08-15 ENCOUNTER — Other Ambulatory Visit: Payer: Self-pay

## 2020-08-15 DIAGNOSIS — E113411 Type 2 diabetes mellitus with severe nonproliferative diabetic retinopathy with macular edema, right eye: Secondary | ICD-10-CM

## 2020-08-15 DIAGNOSIS — E113412 Type 2 diabetes mellitus with severe nonproliferative diabetic retinopathy with macular edema, left eye: Secondary | ICD-10-CM | POA: Diagnosis not present

## 2020-08-15 DIAGNOSIS — H2512 Age-related nuclear cataract, left eye: Secondary | ICD-10-CM | POA: Diagnosis not present

## 2020-08-15 MED ORDER — BEVACIZUMAB 2.5 MG/0.1ML IZ SOSY
2.5000 mg | PREFILLED_SYRINGE | INTRAVITREAL | Status: AC | PRN
  Administered 2020-08-15: 2.5 mg via INTRAVITREAL

## 2020-08-15 NOTE — Assessment & Plan Note (Signed)
CSME temporal aspect of fovea, stable at current interval at 6-week interval today  Dilate next OD as scheduled

## 2020-08-15 NOTE — Assessment & Plan Note (Signed)
I discussed with the patient the darkening effect of cataract at this stage with well clear, darkens of vision like dark yellow-green sunglasses would.

## 2020-08-15 NOTE — Progress Notes (Signed)
08/15/2020     CHIEF COMPLAINT Patient presents for Retina Follow Up (5 WK FU OS, POSS AVASTIN OS///Pt reports stable vision OU. Pt denies any new F/F, pain, or pressure OU///Last BS: has been around 116-121, hasn't checked recently )   HISTORY OF PRESENT ILLNESS: Damon Navarro is a 85 y.o. male who presents to the clinic today for:   HPI    Retina Follow Up    Patient presents with  Diabetic Retinopathy.  In left eye.  This started 5 weeks ago.  Duration of 5 weeks.  Since onset it is stable. Additional comments: 5 WK FU OS, POSS AVASTIN OS   Pt reports stable vision OU. Pt denies any new F/F, pain, or pressure OU   Last BS: has been around 116-121, hasn't checked recently        Last edited by Nichola Sizer D on 08/15/2020 10:00 AM. (History)      Referring physician: Curlene Labrum, MD [place],  [place] [address]  HISTORICAL INFORMATION:   Selected notes from the MEDICAL RECORD NUMBER    Lab Results  Component Value Date   HGBA1C 7.0 (H) 10/07/2015     CURRENT MEDICATIONS: No current outpatient medications on file. (Ophthalmic Drugs)   No current facility-administered medications for this visit. (Ophthalmic Drugs)   Current Outpatient Medications (Other)  Medication Sig  . budesonide-formoterol (SYMBICORT) 160-4.5 MCG/ACT inhaler Inhale 2 puffs into the lungs 2 (two) times daily.  . calcium carbonate (TUMS EX) 750 MG chewable tablet Chew 1 tablet by mouth daily as needed for heartburn.  . magnesium hydroxide (MILK OF MAGNESIA) 400 MG/5ML suspension Take 30 mLs by mouth daily as needed for mild constipation.  . metFORMIN (GLUCOPHAGE) 500 MG tablet Take by mouth 2 (two) times daily with a meal.  . traMADol-acetaminophen (ULTRACET) 37.5-325 MG tablet Take 1 tablet by mouth every 6 (six) hours as needed.  . trimethoprim (TRIMPEX) 100 MG tablet Take 1 tablet (100 mg total) by mouth daily.   No current facility-administered medications for this visit.  (Other)      REVIEW OF SYSTEMS:    ALLERGIES Allergies  Allergen Reactions  . Other Other (See Comments)    NO BLOOD PRODUCTS  . Aspirin Hives    PAST MEDICAL HISTORY Past Medical History:  Diagnosis Date  . Asthma   . Bronchitis    last bout 2-3 weeks ago   . COPD (chronic obstructive pulmonary disease) ([place])   . Diabetes mellitus without complication ([place])   . GERD (gastroesophageal reflux disease)   . Hemorrhoids   . Nocturia   . Shortness of breath dyspnea    with congestion only   . Urinary hesitancy    Past Surgical History:  Procedure Laterality Date  . BACK SURGERY    . NOSE SURGERY    . PROSTATECTOMY N/A 10/14/2015   Procedure: PROSTATECTOMY TRANSVESICAL;  Surgeon: Carolan Clines, MD;  Location: [facility];  Service: Urology;  Laterality: N/A;    FAMILY HISTORY History reviewed. No pertinent family history.  SOCIAL HISTORY Social History   Tobacco Use  . Smoking status: Never Smoker  . Smokeless tobacco: Never Used  Substance Use Topics  . Alcohol use: No  . Drug use: No         OPHTHALMIC EXAM: Base Eye Exam    Visual Acuity (ETDRS)      Right Left   Dist [place] 20/40 -1 20/30 -2   Dist ph [place] NI  NI       Tonometry (Tonopen, 10:04 AM)      Right Left   Pressure 12 14       Pupils      Pupils Dark Light Shape React APD   Right PERRL 4 4 Round Minimal None   Left PERRL 4 4 Round Minimal None       Visual Fields (Counting fingers)      Left Right    Full Full       Extraocular Movement      Right Left    Full Full       Neuro/Psych    Oriented x3: Yes   Mood/Affect: Normal       Dilation    Left eye: 1.0% Mydriacyl, 2.5% Phenylephrine @ 10:04 AM        Slit Lamp and Fundus Exam    External Exam      Right Left   External Normal Normal       Slit Lamp Exam      Right Left   Lids/Lashes Normal Normal   Conjunctiva/Sclera White and quiet White and quiet   Cornea Clear Clear   Anterior Chamber Deep and quiet Deep  and quiet   Iris Round and reactive Round and reactive   Lens 2+ Nuclear sclerosis 1+ Nuclear sclerosis   Anterior Vitreous Normal Normal       Fundus Exam      Right Left   Posterior Vitreous  Posterior vitreous detachment   Disc  Normal   C/D Ratio  0.4   Macula  Mild clinically significant macular edema, Microaneurysms,, Exudates temporal portion of macula   Vessels   NPDR-Severe   Periphery  Normal          IMAGING AND PROCEDURES  Imaging and Procedures for 08/15/20  OCT, Retina - OU - Both Eyes       Right Eye Quality was good. Scan locations included subfoveal. Central Foveal Thickness: 296. Progression has improved. Findings include abnormal foveal contour, cystoid macular edema.   Left Eye Quality was good. Scan locations included subfoveal. Central Foveal Thickness: 335. Progression has worsened. Findings include abnormal foveal contour, cystoid macular edema.   Notes OD with much improved CSME, focal perifoveal, currently at 6-week follow-up,   OS with increasing CSME temporal as well as inferior to the fovea, with center involvement will need, needs intravitreal Avastin OS today, at 5-week follow-up.       Intravitreal Injection, Pharmacologic Agent - OS - Left Eye       Time Out 08/15/2020. 10:31 AM. Confirmed correct patient, procedure, site, and patient consented.   Anesthesia Topical anesthesia was used. Anesthetic medications included Akten 3.5%.   Procedure Preparation included Ofloxacin , 10% betadine to eyelids, 5% betadine to ocular surface. A 30 gauge needle was used.   Injection:  2.5 mg Bevacizumab (AVASTIN) 2.5mg /0.68mL SOSY   NDC: 32671-245-80, Lot: [id number]   Route: Intravitreal, Site: Left Eye  Post-op Post injection exam found visual acuity of at least counting fingers. The patient tolerated the procedure well. There were no complications. The patient received written and verbal post procedure care education. Post injection  medications were not given.                 ASSESSMENT/PLAN:  Nuclear sclerotic cataract of left eye I discussed with the patient the darkening effect of cataract at this stage with well clear, darkens of vision like dark yellow-green sunglasses would.  Severe nonproliferative diabetic retinopathy of left eye, with macular edema, associated with type 2 diabetes mellitus ([place]) Center involved CSME temporal aspect of the fovea, stable at 5-week interval with good acuity will preserve functional vision and repeat injection Avastin OS today  Severe nonproliferative diabetic retinopathy of right eye, with macular edema, associated with type 2 diabetes mellitus (HCC) CSME temporal aspect of fovea, stable at current interval at 6-week interval today  Dilate next OD as scheduled      ICD-10-CM   1. Severe nonproliferative diabetic retinopathy of left eye, with macular edema, associated with type 2 diabetes mellitus (HCC)  Y24.8250 OCT, Retina - OU - Both Eyes    Intravitreal Injection, Pharmacologic Agent - OS - Left Eye    bevacizumab (AVASTIN) SOSY 2.5 mg  2. Nuclear sclerotic cataract of left eye  H25.12   3. Severe nonproliferative diabetic retinopathy of right eye, with macular edema, associated with type 2 diabetes mellitus ([place])  E11.3411     1.  Center involved CSME continues left eye, will repeat injection Avastin today.  If CSME continues may need repeat fluorescein angiography in the future to allow for evaluation of peripheral nonperfusion.  Follow-up left eye 5 weeks  2.  Dilate OD next as scheduled next week  3.  Ophthalmic Meds Ordered this visit:  Meds ordered this encounter  Medications  . bevacizumab (AVASTIN) SOSY 2.5 mg       Return in about 5 weeks (around 09/19/2020) for dilate, OS, AVASTIN OCT,,, and dilate OD next as scheduled.  There are no Patient Instructions on file for this visit.   Explained the diagnoses, plan, and follow up with the  patient and they expressed understanding.  Patient expressed understanding of the importance of proper follow up care.   Clent Demark Augie Vane M.D. Diseases & Surgery of the Retina and Vitreous Retina & Diabetic [place] 08/15/20     Abbreviations: M myopia (nearsighted); A astigmatism; H hyperopia (farsighted); P presbyopia; Mrx spectacle prescription;  CTL contact lenses; OD right eye; OS left eye; OU both eyes  XT exotropia; ET esotropia; PEK punctate epithelial keratitis; PEE punctate epithelial erosions; DES dry eye syndrome; MGD meibomian gland dysfunction; ATs artificial tears; PFAT's preservative free artificial tears; [place] nuclear sclerotic cataract; PSC posterior subcapsular cataract; ERM epi-retinal membrane; PVD posterior vitreous detachment; RD retinal detachment; DM diabetes mellitus; DR diabetic retinopathy; NPDR non-proliferative diabetic retinopathy; PDR proliferative diabetic retinopathy; CSME clinically significant macular edema; DME diabetic macular edema; dbh dot blot hemorrhages; CWS cotton wool spot; POAG primary open angle glaucoma; C/D cup-to-disc ratio; HVF humphrey visual field; GVF goldmann visual field; OCT optical coherence tomography; IOP intraocular pressure; BRVO Branch retinal vein occlusion; CRVO central retinal vein occlusion; CRAO central retinal artery occlusion; BRAO branch retinal artery occlusion; RT retinal tear; SB scleral buckle; PPV pars plana vitrectomy; VH Vitreous hemorrhage; PRP panretinal laser photocoagulation; IVK intravitreal kenalog; VMT vitreomacular traction; MH Macular hole;  NVD neovascularization of the disc; NVE neovascularization elsewhere; AREDS age related eye disease study; ARMD age related macular degeneration; POAG primary open angle glaucoma; EBMD epithelial/anterior basement membrane dystrophy; ACIOL anterior chamber intraocular lens; IOL intraocular lens; PCIOL posterior chamber intraocular lens; Phaco/IOL phacoemulsification with intraocular  lens placement; [place] photorefractive keratectomy; LASIK laser assisted in situ keratomileusis; HTN hypertension; DM diabetes mellitus; COPD chronic obstructive pulmonary disease

## 2020-08-15 NOTE — Assessment & Plan Note (Signed)
Center involved CSME temporal aspect of the fovea, stable at 5-week interval with good acuity will preserve functional vision and repeat injection Avastin OS today

## 2020-08-29 ENCOUNTER — Encounter (INDEPENDENT_AMBULATORY_CARE_PROVIDER_SITE_OTHER): Payer: Self-pay | Admitting: Ophthalmology

## 2020-08-29 ENCOUNTER — Other Ambulatory Visit: Payer: Self-pay

## 2020-08-29 ENCOUNTER — Ambulatory Visit (INDEPENDENT_AMBULATORY_CARE_PROVIDER_SITE_OTHER): Payer: PPO | Admitting: Ophthalmology

## 2020-08-29 DIAGNOSIS — E113411 Type 2 diabetes mellitus with severe nonproliferative diabetic retinopathy with macular edema, right eye: Secondary | ICD-10-CM

## 2020-08-29 DIAGNOSIS — H2511 Age-related nuclear cataract, right eye: Secondary | ICD-10-CM | POA: Diagnosis not present

## 2020-08-29 DIAGNOSIS — E113412 Type 2 diabetes mellitus with severe nonproliferative diabetic retinopathy with macular edema, left eye: Secondary | ICD-10-CM | POA: Diagnosis not present

## 2020-08-29 MED ORDER — BEVACIZUMAB 2.5 MG/0.1ML IZ SOSY
2.5000 mg | PREFILLED_SYRINGE | INTRAVITREAL | Status: AC | PRN
  Administered 2020-08-29: 2.5 mg via INTRAVITREAL

## 2020-08-29 NOTE — Assessment & Plan Note (Signed)
Follow-up examination left eye as scheduled

## 2020-08-29 NOTE — Assessment & Plan Note (Signed)

## 2020-08-29 NOTE — Progress Notes (Signed)
08/29/2020     CHIEF COMPLAINT Patient presents for Retina Follow Up (8 Week F/U OD, poss Avastin OD//Pt denies noticeable changes to [place] OU since last visit. Pt denies ocular pain, flashes of light, or floaters OU. //LBS: 175 this AM)   HISTORY OF PRESENT ILLNESS: Damon Navarro is a 85 y.o. male who presents to the clinic today for:   HPI    Retina Follow Up    Patient presents with  Diabetic Retinopathy.  In right eye.  This started 8 weeks ago.  Severity is mild.  Duration of 8 weeks.  Since onset it is stable. Additional comments: 8 Week F/U OD, poss Avastin OD  Pt denies noticeable changes to [place] OU since last visit. Pt denies ocular pain, flashes of light, or floaters OU.   LBS: 175 this AM       Last edited by Rockie Neighbours, [place] on 08/29/2020  9:57 AM. (History)      Referring physician: Curlene Labrum, MD [place],  [place] [address]  HISTORICAL INFORMATION:   Selected notes from the MEDICAL RECORD NUMBER    Lab Results  Component Value Date   HGBA1C 7.0 (H) 10/07/2015     CURRENT MEDICATIONS: No current outpatient medications on file. (Ophthalmic Drugs)   No current facility-administered medications for this visit. (Ophthalmic Drugs)   Current Outpatient Medications (Other)  Medication Sig  . budesonide-formoterol (SYMBICORT) 160-4.5 MCG/ACT inhaler Inhale 2 puffs into the lungs 2 (two) times daily.  . calcium carbonate (TUMS EX) 750 MG chewable tablet Chew 1 tablet by mouth daily as needed for heartburn.  . magnesium hydroxide (MILK OF MAGNESIA) 400 MG/5ML suspension Take 30 mLs by mouth daily as needed for mild constipation.  . metFORMIN (GLUCOPHAGE) 500 MG tablet Take by mouth 2 (two) times daily with a meal.  . traMADol-acetaminophen (ULTRACET) 37.5-325 MG tablet Take 1 tablet by mouth every 6 (six) hours as needed.  . trimethoprim (TRIMPEX) 100 MG tablet Take 1 tablet (100 mg total) by mouth daily.   No current facility-administered medications  for this visit. (Other)      REVIEW OF SYSTEMS:    ALLERGIES Allergies  Allergen Reactions  . Other Other (See Comments)    NO BLOOD PRODUCTS  . Aspirin Hives    PAST MEDICAL HISTORY Past Medical History:  Diagnosis Date  . Asthma   . Bronchitis    last bout 2-3 weeks ago   . COPD (chronic obstructive pulmonary disease) ([place])   . Diabetes mellitus without complication ([place])   . GERD (gastroesophageal reflux disease)   . Hemorrhoids   . Nocturia   . Shortness of breath dyspnea    with congestion only   . Urinary hesitancy    Past Surgical History:  Procedure Laterality Date  . BACK SURGERY    . NOSE SURGERY    . PROSTATECTOMY N/A 10/14/2015   Procedure: PROSTATECTOMY TRANSVESICAL;  Surgeon: Carolan Clines, MD;  Location: [facility];  Service: Urology;  Laterality: N/A;    FAMILY HISTORY History reviewed. No pertinent family history.  SOCIAL HISTORY Social History   Tobacco Use  . Smoking status: Never Smoker  . Smokeless tobacco: Never Used  Substance Use Topics  . Alcohol use: No  . Drug use: No         OPHTHALMIC EXAM:  Base Eye Exam    Visual Acuity (ETDRS)      Right Left   Dist [place] 20/30 20/25 -2  Dist ph [place] NI        Tonometry (Tonopen, 9:57 AM)      Right Left   Pressure 15 14       Pupils      Pupils Dark Light Shape React APD   Right PERRL 5 5 Round Minimal None   Left PERRL 5 5 Round Minimal None       Visual Fields (Counting fingers)      Left Right    Full Full       Extraocular Movement      Right Left    Full Full       Neuro/Psych    Oriented x3: Yes   Mood/Affect: Normal       Dilation    Right eye: 1.0% Mydriacyl, 2.5% Phenylephrine @ 10:00 AM        Slit Lamp and Fundus Exam    External Exam      Right Left   External Normal Normal       Slit Lamp Exam      Right Left   Lids/Lashes Normal Normal   Conjunctiva/Sclera White and quiet White and quiet   Cornea Clear Clear   Anterior Chamber Deep  and quiet Deep and quiet   Iris Round and reactive Round and reactive   Lens 2+ Nuclear sclerosis 1+ Nuclear sclerosis   Anterior Vitreous Normal Normal       Fundus Exam      Right Left   Posterior Vitreous Central vitreous floaters, Posterior vitreous detachment    Disc Normal    C/D Ratio 0.5    Macula Few Microaneurysms, no clinically detectable macular thickening    Vessels NPDR-Severe    Periphery Normal           IMAGING AND PROCEDURES  Imaging and Procedures for 08/29/20  OCT, Retina - OU - Both Eyes       Right Eye Quality was good. Scan locations included subfoveal. Central Foveal Thickness: 298. Progression has improved. Findings include abnormal foveal contour, cystoid macular edema.   Left Eye Quality was good. Scan locations included subfoveal. Central Foveal Thickness: 334. Progression has been stable. Findings include abnormal foveal contour, cystoid macular edema.   Notes Bilateral clinically significant macular edema, improving on intravitreal Avastin.  Today OD at 8-week follow-up improved overall.  Will need repeat injection Avastin OD today and consider peripheral PRP if fluorescein angiography in the future does confirm retinal nonperfusion       Intravitreal Injection, Pharmacologic Agent - OD - Right Eye       Time Out 08/29/2020. 10:24 AM. Confirmed correct patient, procedure, site, and patient consented.   Anesthesia Topical anesthesia was used. Anesthetic medications included Akten 3.5%.   Procedure Preparation included Tobramycin 0.3%, 10% betadine to eyelids, 5% betadine to ocular surface. A 30 gauge needle was used.   Injection:  2.5 mg Bevacizumab (AVASTIN) 2.5mg /0.70mL SOSY   NDC: 87867-672-09, Lot: [id number]   Route: Intravitreal, Site: Right Eye  Post-op Post injection exam found visual acuity of at least counting fingers. The patient tolerated the procedure well. There were no complications. The patient received written and verbal  post procedure care education. Post injection medications were not given.                 ASSESSMENT/PLAN:  Severe nonproliferative diabetic retinopathy of right eye, with macular edema, associated with type 2 diabetes mellitus ([place]) Follow-up today 8 weeks post injection Avastin.  Overall improving.  We will repeat injection Avastin today and examination again in 2 weeks with fundus fluorescein angiography looking for retinal nonperfusion possible PRP peripherally to decrease vegF burden and decrease treatment burden  Severe nonproliferative diabetic retinopathy of left eye, with macular edema, associated with type 2 diabetes mellitus ([place]) Follow-up examination left eye as scheduled  Nuclear sclerotic cataract of right eye The nature of cataract was discussed with the patient as well as the elective nature of surgery. The patient was reassured that surgery at a later date does not put the patient at risk for a worse outcome. It was emphasized that the need for surgery is dictated by the patient's quality of life as influenced by the cataract. Patient was instructed to maintain close follow up with their general eye care doctor.      ICD-10-CM   1. Severe nonproliferative diabetic retinopathy of right eye, with macular edema, associated with type 2 diabetes mellitus (HCC)  E11.3411 OCT, Retina - OU - Both Eyes    Intravitreal Injection, Pharmacologic Agent - OD - Right Eye    bevacizumab (AVASTIN) SOSY 2.5 mg  2. Severe nonproliferative diabetic retinopathy of left eye, with macular edema, associated with type 2 diabetes mellitus (HCC)  J81.1914 OCT, Retina - OU - Both Eyes  3. Nuclear sclerotic cataract of right eye  H25.11     1.  Repeat intraocular injection Avastin OD today and dilate OU next in 3 weeks  2.  We will need fluorescein angiography OU next coincident with evaluation and possible injection Avastin OS  3.  After follow-up in 3 weeks and possible injection left eye,  will need planning PRP right eye  Ophthalmic Meds Ordered this visit:  Meds ordered this encounter  Medications  . bevacizumab (AVASTIN) SOSY 2.5 mg       Return in about 3 weeks (around 09/19/2020) for DILATE OU, OPTOS FFA R/L, OS, AVASTIN OCT, as scheduled.  There are no Patient Instructions on file for this visit.   Explained the diagnoses, plan, and follow up with the patient and they expressed understanding.  Patient expressed understanding of the importance of proper follow up care.   Clent Demark Eastyn Dattilo M.D. Diseases & Surgery of the Retina and Vitreous Retina & Diabetic [place] 08/29/20     Abbreviations: M myopia (nearsighted); A astigmatism; H hyperopia (farsighted); P presbyopia; Mrx spectacle prescription;  CTL contact lenses; OD right eye; OS left eye; OU both eyes  XT exotropia; ET esotropia; PEK punctate epithelial keratitis; PEE punctate epithelial erosions; DES dry eye syndrome; MGD meibomian gland dysfunction; ATs artificial tears; PFAT's preservative free artificial tears; [place] nuclear sclerotic cataract; PSC posterior subcapsular cataract; ERM epi-retinal membrane; PVD posterior vitreous detachment; RD retinal detachment; DM diabetes mellitus; DR diabetic retinopathy; NPDR non-proliferative diabetic retinopathy; PDR proliferative diabetic retinopathy; CSME clinically significant macular edema; DME diabetic macular edema; dbh dot blot hemorrhages; CWS cotton wool spot; POAG primary open angle glaucoma; C/D cup-to-disc ratio; HVF humphrey visual field; GVF goldmann visual field; OCT optical coherence tomography; IOP intraocular pressure; BRVO Branch retinal vein occlusion; CRVO central retinal vein occlusion; CRAO central retinal artery occlusion; BRAO branch retinal artery occlusion; RT retinal tear; SB scleral buckle; PPV pars plana vitrectomy; VH Vitreous hemorrhage; PRP panretinal laser photocoagulation; IVK intravitreal kenalog; VMT vitreomacular traction; MH Macular  hole;  NVD neovascularization of the disc; NVE neovascularization elsewhere; AREDS age related eye disease study; ARMD age related macular degeneration; POAG primary open angle glaucoma; EBMD epithelial/anterior basement membrane dystrophy; ACIOL anterior chamber  intraocular lens; IOL intraocular lens; PCIOL posterior chamber intraocular lens; Phaco/IOL phacoemulsification with intraocular lens placement; [place] photorefractive keratectomy; LASIK laser assisted in situ keratomileusis; HTN hypertension; DM diabetes mellitus; COPD chronic obstructive pulmonary disease

## 2020-08-29 NOTE — Assessment & Plan Note (Signed)
Follow-up today 8 weeks post injection Avastin.  Overall improving.  We will repeat injection Avastin today and examination again in 2 weeks with fundus fluorescein angiography looking for retinal nonperfusion possible PRP peripherally to decrease vegF burden and decrease treatment burden

## 2020-09-19 ENCOUNTER — Encounter (INDEPENDENT_AMBULATORY_CARE_PROVIDER_SITE_OTHER): Payer: Self-pay | Admitting: Ophthalmology

## 2020-09-19 ENCOUNTER — Other Ambulatory Visit: Payer: Self-pay

## 2020-09-19 ENCOUNTER — Ambulatory Visit (INDEPENDENT_AMBULATORY_CARE_PROVIDER_SITE_OTHER): Payer: PPO | Admitting: Ophthalmology

## 2020-09-19 DIAGNOSIS — E113411 Type 2 diabetes mellitus with severe nonproliferative diabetic retinopathy with macular edema, right eye: Secondary | ICD-10-CM | POA: Diagnosis not present

## 2020-09-19 DIAGNOSIS — E113412 Type 2 diabetes mellitus with severe nonproliferative diabetic retinopathy with macular edema, left eye: Secondary | ICD-10-CM | POA: Diagnosis not present

## 2020-09-19 DIAGNOSIS — H2511 Age-related nuclear cataract, right eye: Secondary | ICD-10-CM

## 2020-09-19 MED ORDER — FLUORESCEIN SODIUM 10 % IV SOLN
500.0000 mg | INTRAVENOUS | Status: AC | PRN
  Administered 2020-09-19: 500 mg via INTRAVENOUS

## 2020-09-19 MED ORDER — BEVACIZUMAB 2.5 MG/0.1ML IZ SOSY
2.5000 mg | PREFILLED_SYRINGE | INTRAVITREAL | Status: AC | PRN
  Administered 2020-09-19: 2.5 mg via INTRAVITREAL

## 2020-09-19 NOTE — Assessment & Plan Note (Signed)
Minor perifoveal leakage temporally, with no extension of disease.  Stable on intravitreal Avastin at this time.

## 2020-09-19 NOTE — Assessment & Plan Note (Signed)
Still active disease OS, will need repeat injection intravitreal Avastin OS

## 2020-09-19 NOTE — Progress Notes (Signed)
09/19/2020     CHIEF COMPLAINT Patient presents for Retina Follow Up (5 Week NPDR f\u OS. FFA R/L. Possible Avastin OS. OCT and FP/Pt denies changes in vision. /BGL: 129)   HISTORY OF PRESENT ILLNESS: Damon Navarro is a 85 y.o. male who presents to the clinic today for:   HPI    Retina Follow Up    Patient presents with  Diabetic Retinopathy.  In both eyes.  Severity is severe.  Duration of 5 weeks.  Since onset it is stable.  I, the attending physician,  performed the HPI with the patient and updated documentation appropriately. Additional comments: 5 Week NPDR f\u OS. FFA R/L. Possible Avastin OS. OCT and FP Pt denies changes in vision.  BGL: 129       Last edited by Tilda Franco on 09/19/2020  9:44 AM. (History)      Referring physician: Curlene Labrum, MD [place],  [place] [address]  HISTORICAL INFORMATION:   Selected notes from the MEDICAL RECORD NUMBER    Lab Results  Component Value Date   HGBA1C 7.0 (H) 10/07/2015     CURRENT MEDICATIONS: No current outpatient medications on file. (Ophthalmic Drugs)   No current facility-administered medications for this visit. (Ophthalmic Drugs)   Current Outpatient Medications (Other)  Medication Sig  . budesonide-formoterol (SYMBICORT) 160-4.5 MCG/ACT inhaler Inhale 2 puffs into the lungs 2 (two) times daily.  . calcium carbonate (TUMS EX) 750 MG chewable tablet Chew 1 tablet by mouth daily as needed for heartburn.  . magnesium hydroxide (MILK OF MAGNESIA) 400 MG/5ML suspension Take 30 mLs by mouth daily as needed for mild constipation.  . metFORMIN (GLUCOPHAGE) 500 MG tablet Take by mouth 2 (two) times daily with a meal.  . traMADol-acetaminophen (ULTRACET) 37.5-325 MG tablet Take 1 tablet by mouth every 6 (six) hours as needed.  . trimethoprim (TRIMPEX) 100 MG tablet Take 1 tablet (100 mg total) by mouth daily.   No current facility-administered medications for this visit. (Other)      REVIEW OF  SYSTEMS: ROS    Positive for: Endocrine   Last edited by Tilda Franco on 09/19/2020  9:44 AM. (History)       ALLERGIES Allergies  Allergen Reactions  . Other Other (See Comments)    NO BLOOD PRODUCTS  . Aspirin Hives    PAST MEDICAL HISTORY Past Medical History:  Diagnosis Date  . Asthma   . Bronchitis    last bout 2-3 weeks ago   . COPD (chronic obstructive pulmonary disease) ([place])   . Diabetes mellitus without complication ([place])   . GERD (gastroesophageal reflux disease)   . Hemorrhoids   . Nocturia   . Shortness of breath dyspnea    with congestion only   . Urinary hesitancy    Past Surgical History:  Procedure Laterality Date  . BACK SURGERY    . NOSE SURGERY    . PROSTATECTOMY N/A 10/14/2015   Procedure: PROSTATECTOMY TRANSVESICAL;  Surgeon: Carolan Clines, MD;  Location: [facility];  Service: Urology;  Laterality: N/A;    FAMILY HISTORY History reviewed. No pertinent family history.  SOCIAL HISTORY Social History   Tobacco Use  . Smoking status: Never Smoker  . Smokeless tobacco: Never Used  Substance Use Topics  . Alcohol use: No  . Drug use: No         OPHTHALMIC EXAM:  Base Eye Exam    Visual Acuity (Snellen - Linear)  Right Left   Dist [place] 20/30 -1 20/30 -2       Tonometry (Tonopen, 9:48 AM)      Right Left   Pressure 18 18       Pupils      Pupils Dark Light Shape React APD   Right PERRL 4 3 Round Slow None   Left PERRL 4 3 Round Slow None       Neuro/Psych    Oriented x3: Yes   Mood/Affect: Normal       Dilation    Both eyes: 1.0% Mydriacyl, 2.5% Phenylephrine @ 9:48 AM        Slit Lamp and Fundus Exam    External Exam      Right Left   External Normal Normal       Slit Lamp Exam      Right Left   Lids/Lashes Normal Normal   Conjunctiva/Sclera White and quiet White and quiet   Cornea Clear Clear   Anterior Chamber Deep and quiet Deep and quiet   Iris Round and reactive Round and reactive   Lens 2+  Nuclear sclerosis 1+ Nuclear sclerosis   Anterior Vitreous Normal Normal       Fundus Exam      Right Left   Posterior Vitreous Central vitreous floaters, Posterior vitreous detachment Posterior vitreous detachment   Disc Normal Normal   C/D Ratio 0.5 0.4   Macula Few Microaneurysms, no clinically detectable macular thickening Mild clinically significant macular edema, Microaneurysms,, Exudates temporal portion of macula   Vessels NPDR-Severe  NPDR-Severe   Periphery Normal Normal          IMAGING AND PROCEDURES  Imaging and Procedures for 09/19/20  OCT, Retina - OU - Both Eyes       Right Eye Quality was good. Scan locations included subfoveal. Central Foveal Thickness: 287. Progression has been stable. Findings include abnormal foveal contour, cystoid macular edema.   Left Eye Quality was good. Central Foveal Thickness: 337. Findings include cystoid macular edema.   Notes CSME OU, stable OD and minor will observe for now and left eye needs treatment today with intravitreal Avastin       Color Fundus Photography Optos - OU - Both Eyes       Right Eye Progression has been stable. Disc findings include normal observations. Macula : microaneurysms.   Left Eye Progression has been stable. Disc findings include normal observations. Macula : microaneurysms.   Notes Severe NPDR , CSME  OU       Fluorescein Angiography Optos (Transit OD)       Injection:  500 mg Fluorescein Sodium 10 % injection   NDC: [phone]   Route: IntravenousRight Eye Mid/Late phase findings include leakage.   Left Eye   Early phase findings include leakage. Mid/Late phase findings include leakage, microaneurysm.   Notes Severe NPDR OU, minor CSME OD, and significant CSME OS will need ongoing therapy       Intravitreal Injection, Pharmacologic Agent - OS - Left Eye       Time Out 09/19/2020. 10:25 AM. Confirmed correct patient, procedure, site, and patient consented.    Anesthesia Topical anesthesia was used. Anesthetic medications included Akten 3.5%.   Procedure Preparation included Ofloxacin , 10% betadine to eyelids, 5% betadine to ocular surface. A 30 gauge needle was used.   Injection:  2.5 mg Bevacizumab (AVASTIN) 2.5mg /0.34mL SOSY   NDC: 65465-035-46, Lot: [id number]   Route: Intravitreal, Site: Left Eye  Post-op Post injection exam  found visual acuity of at least counting fingers. The patient tolerated the procedure well. There were no complications. The patient received written and verbal post procedure care education. Post injection medications were not given.                 ASSESSMENT/PLAN:  Nuclear sclerotic cataract of right eye Stable OU  Severe nonproliferative diabetic retinopathy of left eye, with macular edema, associated with type 2 diabetes mellitus (HCC) Still active disease OS, will need repeat injection intravitreal Avastin OS  Severe nonproliferative diabetic retinopathy of right eye, with macular edema, associated with type 2 diabetes mellitus ([place]) Minor perifoveal leakage temporally, with no extension of disease.  Stable on intravitreal Avastin at this time.      ICD-10-CM   1. Severe nonproliferative diabetic retinopathy of left eye, with macular edema, associated with type 2 diabetes mellitus (HCC)  B15.1761 OCT, Retina - OU - Both Eyes    Color Fundus Photography Optos - OU - Both Eyes    Fluorescein Angiography Optos (Transit OD)    Intravitreal Injection, Pharmacologic Agent - OS - Left Eye    bevacizumab (AVASTIN) SOSY 2.5 mg    Fluorescein Sodium 10 % injection 500 mg  2. Severe nonproliferative diabetic retinopathy of right eye, with macular edema, associated with type 2 diabetes mellitus (HCC)  E11.3411 OCT, Retina - OU - Both Eyes    Color Fundus Photography Optos - OU - Both Eyes    Fluorescein Angiography Optos (Transit OD)    Fluorescein Sodium 10 % injection 500 mg  3. Nuclear sclerotic cataract  of right eye  H25.11     1.  OS with persistent CSME particularly temporal to the fovea with prominent angiographic evidence of microaneurysms temporally.  We will treat today with intravitreal Avastin and may need focal laser photocoagulation to the spinal region in the coming months.  2.  OD, follow-up dilated examination as scheduled  3.  Ophthalmic Meds Ordered this visit:  Meds ordered this encounter  Medications  . bevacizumab (AVASTIN) SOSY 2.5 mg  . Fluorescein Sodium 10 % injection 500 mg       Return in about 5 weeks (around 10/24/2020) for dilate, OS, AVASTIN OCT.  There are no Patient Instructions on file for this visit.   Explained the diagnoses, plan, and follow up with the patient and they expressed understanding.  Patient expressed understanding of the importance of proper follow up care.   Clent Demark Rankin M.D. Diseases & Surgery of the Retina and Vitreous Retina [facility] Diabetic [place] 09/19/20     Abbreviations: M myopia (nearsighted); A astigmatism; H hyperopia (farsighted); P presbyopia; Mrx spectacle prescription;  CTL contact lenses; OD right eye; OS left eye; OU both eyes  XT exotropia; ET esotropia; PEK punctate epithelial keratitis; PEE punctate epithelial erosions; DES dry eye syndrome; MGD meibomian gland dysfunction; ATs artificial tears; PFAT's preservative free artificial tears; [place] nuclear sclerotic cataract; PSC posterior subcapsular cataract; ERM epi-retinal membrane; PVD posterior vitreous detachment; RD retinal detachment; DM diabetes mellitus; DR diabetic retinopathy; NPDR non-proliferative diabetic retinopathy; PDR proliferative diabetic retinopathy; CSME clinically significant macular edema; DME diabetic macular edema; dbh dot blot hemorrhages; CWS cotton wool spot; POAG primary open angle glaucoma; C/D cup-to-disc ratio; HVF humphrey visual field; GVF goldmann visual field; OCT optical coherence tomography; IOP intraocular pressure; BRVO Branch  retinal vein occlusion; CRVO central retinal vein occlusion; CRAO central retinal artery occlusion; BRAO branch retinal artery occlusion; RT retinal tear; SB scleral buckle; PPV pars  plana vitrectomy; VH Vitreous hemorrhage; PRP panretinal laser photocoagulation; IVK intravitreal kenalog; VMT vitreomacular traction; MH Macular hole;  NVD neovascularization of the disc; NVE neovascularization elsewhere; AREDS age related eye disease study; ARMD age related macular degeneration; POAG primary open angle glaucoma; EBMD epithelial/anterior basement membrane dystrophy; ACIOL anterior chamber intraocular lens; IOL intraocular lens; PCIOL posterior chamber intraocular lens; Phaco/IOL phacoemulsification with intraocular lens placement; [place] photorefractive keratectomy; LASIK laser assisted in situ keratomileusis; HTN hypertension; DM diabetes mellitus; COPD chronic obstructive pulmonary disease

## 2020-09-19 NOTE — Assessment & Plan Note (Signed)
Stable OU 

## 2020-09-27 DIAGNOSIS — J069 Acute upper respiratory infection, unspecified: Secondary | ICD-10-CM | POA: Diagnosis not present

## 2020-09-27 DIAGNOSIS — K120 Recurrent oral aphthae: Secondary | ICD-10-CM | POA: Diagnosis not present

## 2020-09-27 DIAGNOSIS — H6120 Impacted cerumen, unspecified ear: Secondary | ICD-10-CM | POA: Diagnosis not present

## 2020-09-27 DIAGNOSIS — Z6820 Body mass index (BMI) 20.0-20.9, adult: Secondary | ICD-10-CM | POA: Diagnosis not present

## 2020-11-03 ENCOUNTER — Ambulatory Visit (INDEPENDENT_AMBULATORY_CARE_PROVIDER_SITE_OTHER): Payer: PPO | Admitting: Ophthalmology

## 2020-11-03 ENCOUNTER — Encounter (INDEPENDENT_AMBULATORY_CARE_PROVIDER_SITE_OTHER): Payer: Self-pay | Admitting: Ophthalmology

## 2020-11-03 ENCOUNTER — Other Ambulatory Visit: Payer: Self-pay

## 2020-11-03 DIAGNOSIS — E113412 Type 2 diabetes mellitus with severe nonproliferative diabetic retinopathy with macular edema, left eye: Secondary | ICD-10-CM

## 2020-11-03 DIAGNOSIS — H2512 Age-related nuclear cataract, left eye: Secondary | ICD-10-CM | POA: Diagnosis not present

## 2020-11-03 MED ORDER — BEVACIZUMAB 2.5 MG/0.1ML IZ SOSY
2.5000 mg | PREFILLED_SYRINGE | INTRAVITREAL | Status: AC | PRN
  Administered 2020-11-03: 2.5 mg via INTRAVITREAL

## 2020-11-03 NOTE — Assessment & Plan Note (Signed)
Some component of vision impairment in the left eye may be from cataractThe nature of cataract was discussed with the patient as well as the elective nature of surgery. The patient was reassured that surgery at a later date does not put the patient at risk for a worse outcome. It was emphasized that the need for surgery is dictated by the patient's quality of life as influenced by the cataract. Patient was instructed to maintain close follow up with their general eye care doctor.

## 2020-11-03 NOTE — Progress Notes (Signed)
11/03/2020     CHIEF COMPLAINT Patient presents for Retina Follow Up (5wk fu OS/Avastin OS//Pt states, "My va seems to be ok but sometimes in the morning they are blurry."/A1C: 6.8/LBS: 88)   HISTORY OF PRESENT ILLNESS: Damon Navarro is a 85 y.o. male who presents to the clinic today for:   HPI    Retina Follow Up    Diagnosis: Diabetic Retinopathy   Laterality: left eye   Onset: 5 weeks ago   Duration: 5 weeks   Course: stable   Comments: 5wk fu OS/Avastin OS  Pt states, "My va seems to be ok but sometimes in the morning they are blurry." A1C: 6.8 LBS: 88       Last edited by Kendra Opitz, COA on 11/03/2020  8:29 AM. (History)      Referring physician: Curlene Labrum, MD [place],  [place] [address]  HISTORICAL INFORMATION:   Selected notes from the MEDICAL RECORD NUMBER    Lab Results  Component Value Date   HGBA1C 7.0 (H) 10/07/2015     CURRENT MEDICATIONS: No current outpatient medications on file. (Ophthalmic Drugs)   No current facility-administered medications for this visit. (Ophthalmic Drugs)   Current Outpatient Medications (Other)  Medication Sig  . budesonide-formoterol (SYMBICORT) 160-4.5 MCG/ACT inhaler Inhale 2 puffs into the lungs 2 (two) times daily.  . calcium carbonate (TUMS EX) 750 MG chewable tablet Chew 1 tablet by mouth daily as needed for heartburn.  . magnesium hydroxide (MILK OF MAGNESIA) 400 MG/5ML suspension Take 30 mLs by mouth daily as needed for mild constipation.  . metFORMIN (GLUCOPHAGE) 500 MG tablet Take by mouth 2 (two) times daily with a meal.  . traMADol-acetaminophen (ULTRACET) 37.5-325 MG tablet Take 1 tablet by mouth every 6 (six) hours as needed.  . trimethoprim (TRIMPEX) 100 MG tablet Take 1 tablet (100 mg total) by mouth daily.   No current facility-administered medications for this visit. (Other)      REVIEW OF SYSTEMS:    ALLERGIES Allergies  Allergen Reactions  . Other Other (See Comments)     NO BLOOD PRODUCTS  . Aspirin Hives    PAST MEDICAL HISTORY Past Medical History:  Diagnosis Date  . Asthma   . Bronchitis    last bout 2-3 weeks ago   . COPD (chronic obstructive pulmonary disease) ([place])   . Diabetes mellitus without complication ([place])   . GERD (gastroesophageal reflux disease)   . Hemorrhoids   . Nocturia   . Shortness of breath dyspnea    with congestion only   . Urinary hesitancy    Past Surgical History:  Procedure Laterality Date  . BACK SURGERY    . NOSE SURGERY    . PROSTATECTOMY N/A 10/14/2015   Procedure: PROSTATECTOMY TRANSVESICAL;  Surgeon: Carolan Clines, MD;  Location: [facility];  Service: Urology;  Laterality: N/A;    FAMILY HISTORY History reviewed. No pertinent family history.  SOCIAL HISTORY Social History   Tobacco Use  . Smoking status: Never Smoker  . Smokeless tobacco: Never Used  Substance Use Topics  . Alcohol use: No  . Drug use: No         OPHTHALMIC EXAM: Base Eye Exam    Visual Acuity (ETDRS)      Right Left   Dist [place] 20/30 -2 20/50   Dist [place] 20/30 -1       Tonometry (Tonopen, 8:34 AM)      Right Left  Pressure 11 11       Pupils      Pupils Dark Light Shape React APD   Right PERRL 4 3 Round Slow None   Left PERRL 4 3 Round Slow None       Visual Fields (Counting fingers)      Left Right    Full Full       Neuro/Psych    Oriented x3: Yes   Mood/Affect: Normal       Dilation    Left eye: 1.0% Mydriacyl, 2.5% Phenylephrine @ 8:34 AM        Slit Lamp and Fundus Exam    External Exam      Right Left   External Normal Normal       Slit Lamp Exam      Right Left   Lids/Lashes Normal Normal   Conjunctiva/Sclera White and quiet White and quiet   Cornea Clear Clear   Anterior Chamber Deep and quiet Deep and quiet   Iris Round and reactive Round and reactive   Lens 2+ Nuclear sclerosis 2+ Nuclear sclerosis   Anterior Vitreous Normal Normal       Fundus Exam      Right Left    Posterior Vitreous  Posterior vitreous detachment   Disc  Normal   C/D Ratio  0.4   Macula  Mild clinically significant macular edema, Microaneurysms,, Exudates temporal portion of macula   Vessels   NPDR-Severe   Periphery  Normal          IMAGING AND PROCEDURES  Imaging and Procedures for 11/03/20  OCT, Retina - OU - Both Eyes       Right Eye Quality was good. Scan locations included subfoveal. Central Foveal Thickness: 270. Progression has been stable. Findings include abnormal foveal contour, cystoid macular edema.   Left Eye Quality was good. Scan locations included subfoveal. Central Foveal Thickness: 335. Progression has improved. Findings include abnormal foveal contour, cystoid macular edema.   Notes CSME OU, stable OD and minor will observe for now and left eye needs treatment today at 5-week interval with intravitreal Avastin         Intravitreal Injection, Pharmacologic Agent - OS - Left Eye       Time Out 11/03/2020. 9:39 AM. Confirmed correct patient, procedure, site, and patient consented.   Anesthesia Topical anesthesia was used. Anesthetic medications included Akten 3.5%.   Procedure Preparation included Ofloxacin , 10% betadine to eyelids, 5% betadine to ocular surface. A 30 gauge needle was used.   Injection:  2.5 mg Bevacizumab (AVASTIN) 2.5mg /0.43mL SOSY   NDC: 10175-102-58, Lot: [id number]   Route: Intravitreal, Site: Left Eye  Post-op Post injection exam found visual acuity of at least counting fingers. The patient tolerated the procedure well. There were no complications. The patient received written and verbal post procedure care education. Post injection medications were not given.                 ASSESSMENT/PLAN:  Severe nonproliferative diabetic retinopathy of left eye, with macular edema, associated with type 2 diabetes mellitus (HCC) CSME OS, with center threatening involvement.  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 The  patient was reassured that surgery at a later date does not put the patient at risk for a worse outcome. It was emphasized that the need for surgery is dictated by the patient's quality of life as influenced by the cataract. Patient was instructed to maintain close follow up with their general eye care doctor.      ICD-10-CM   1. Severe nonproliferative diabetic retinopathy of left eye, with macular edema, associated with type 2 diabetes mellitus (HCC)  X83.3825 OCT, Retina - OU - Both Eyes    Intravitreal Injection, Pharmacologic Agent - OS - Left Eye    bevacizumab (AVASTIN) SOSY 2.5 mg  2. Nuclear sclerotic cataract of left eye  H25.12     1.  OS improved overall yet still center threatening CSME on the temporal aspect of the fovea.  Repeat Avastin today at 5-week interval and examination next in 6 weeks to confirm stability of findings  2.  3.  Ophthalmic Meds Ordered this visit:  Meds ordered this encounter  Medications  . bevacizumab (AVASTIN) SOSY 2.5 mg       Return in about 6 weeks (around 12/15/2020) for DILATE OU, AVASTIN OCT, OS.  There are no Patient Instructions on file for this visit.   Explained the diagnoses, plan, and follow up with the patient and they expressed understanding.  Patient expressed understanding of the importance of proper follow up care.   Clent Demark Toben Acuna M.D. Diseases & Surgery of the Retina and Vitreous Retina & Diabetic [place] 11/03/20     Abbreviations: M myopia (nearsighted); A astigmatism; H hyperopia (farsighted); P presbyopia; Mrx spectacle prescription;  CTL contact lenses; OD right eye; OS left eye; OU both eyes   XT exotropia; ET esotropia; PEK punctate epithelial keratitis; PEE punctate epithelial erosions; DES dry eye syndrome; MGD meibomian gland dysfunction; ATs artificial tears; PFAT's preservative free artificial tears; [place] nuclear sclerotic cataract; PSC posterior subcapsular cataract; ERM epi-retinal membrane; PVD posterior vitreous detachment; RD retinal detachment; DM diabetes mellitus; DR diabetic retinopathy; NPDR non-proliferative diabetic retinopathy; PDR proliferative diabetic retinopathy; CSME clinically significant macular edema; DME diabetic macular edema; dbh dot blot hemorrhages; CWS cotton wool spot; POAG primary open angle glaucoma; C/D cup-to-disc ratio; HVF humphrey visual field; GVF goldmann visual field; OCT optical coherence tomography; IOP intraocular pressure; BRVO Branch retinal vein occlusion; CRVO central retinal vein occlusion; CRAO central retinal artery occlusion; BRAO branch retinal artery occlusion; RT retinal tear; SB scleral buckle; PPV pars plana vitrectomy; VH Vitreous hemorrhage; PRP panretinal laser photocoagulation; IVK intravitreal kenalog; VMT vitreomacular traction; MH Macular hole;  NVD neovascularization of the disc; NVE neovascularization elsewhere; AREDS age related eye disease study; ARMD age related macular degeneration; POAG primary open angle glaucoma; EBMD epithelial/anterior basement membrane dystrophy; ACIOL anterior chamber intraocular lens; IOL intraocular lens; PCIOL posterior chamber intraocular lens; Phaco/IOL phacoemulsification with intraocular lens placement; [place] photorefractive keratectomy; LASIK laser assisted in situ keratomileusis; HTN hypertension; DM diabetes mellitus; COPD chronic obstructive pulmonary disease

## 2020-11-03 NOTE — Assessment & Plan Note (Signed)
CSME OS, with center threatening involvement.  Prominent microaneurysms on the edge of the foveal avascular zone not clearly amenable to simple  focal laser treatment Repeat intravitreal Avastin OS today

## 2020-12-02 DIAGNOSIS — E11319 Type 2 diabetes mellitus with unspecified diabetic retinopathy without macular edema: Secondary | ICD-10-CM | POA: Diagnosis not present

## 2020-12-02 DIAGNOSIS — Z1329 Encounter for screening for other suspected endocrine disorder: Secondary | ICD-10-CM | POA: Diagnosis not present

## 2020-12-02 DIAGNOSIS — E1165 Type 2 diabetes mellitus with hyperglycemia: Secondary | ICD-10-CM | POA: Diagnosis not present

## 2020-12-02 DIAGNOSIS — K219 Gastro-esophageal reflux disease without esophagitis: Secondary | ICD-10-CM | POA: Diagnosis not present

## 2020-12-02 DIAGNOSIS — Z13220 Encounter for screening for lipoid disorders: Secondary | ICD-10-CM | POA: Diagnosis not present

## 2020-12-07 DIAGNOSIS — E1165 Type 2 diabetes mellitus with hyperglycemia: Secondary | ICD-10-CM | POA: Diagnosis not present

## 2020-12-07 DIAGNOSIS — K219 Gastro-esophageal reflux disease without esophagitis: Secondary | ICD-10-CM | POA: Diagnosis not present

## 2020-12-07 DIAGNOSIS — Z0001 Encounter for general adult medical examination with abnormal findings: Secondary | ICD-10-CM | POA: Diagnosis not present

## 2020-12-07 DIAGNOSIS — J45909 Unspecified asthma, uncomplicated: Secondary | ICD-10-CM | POA: Diagnosis not present

## 2020-12-07 DIAGNOSIS — Z23 Encounter for immunization: Secondary | ICD-10-CM | POA: Diagnosis not present

## 2020-12-07 DIAGNOSIS — E113412 Type 2 diabetes mellitus with severe nonproliferative diabetic retinopathy with macular edema, left eye: Secondary | ICD-10-CM | POA: Diagnosis not present

## 2020-12-07 DIAGNOSIS — H2512 Age-related nuclear cataract, left eye: Secondary | ICD-10-CM | POA: Diagnosis not present

## 2020-12-07 DIAGNOSIS — H6120 Impacted cerumen, unspecified ear: Secondary | ICD-10-CM | POA: Diagnosis not present

## 2020-12-15 ENCOUNTER — Other Ambulatory Visit: Payer: Self-pay

## 2020-12-15 ENCOUNTER — Ambulatory Visit (INDEPENDENT_AMBULATORY_CARE_PROVIDER_SITE_OTHER): Payer: PPO | Admitting: Ophthalmology

## 2020-12-15 ENCOUNTER — Encounter (INDEPENDENT_AMBULATORY_CARE_PROVIDER_SITE_OTHER): Payer: Self-pay | Admitting: Ophthalmology

## 2020-12-15 DIAGNOSIS — E113411 Type 2 diabetes mellitus with severe nonproliferative diabetic retinopathy with macular edema, right eye: Secondary | ICD-10-CM

## 2020-12-15 DIAGNOSIS — H2511 Age-related nuclear cataract, right eye: Secondary | ICD-10-CM

## 2020-12-15 DIAGNOSIS — H2512 Age-related nuclear cataract, left eye: Secondary | ICD-10-CM | POA: Diagnosis not present

## 2020-12-15 DIAGNOSIS — E113412 Type 2 diabetes mellitus with severe nonproliferative diabetic retinopathy with macular edema, left eye: Secondary | ICD-10-CM | POA: Diagnosis not present

## 2020-12-15 MED ORDER — BEVACIZUMAB 2.5 MG/0.1ML IZ SOSY
2.5000 mg | PREFILLED_SYRINGE | INTRAVITREAL | Status: AC | PRN
  Administered 2020-12-15: 2.5 mg via INTRAVITREAL

## 2020-12-15 NOTE — Assessment & Plan Note (Signed)
Stable, no progression no active CSME

## 2020-12-15 NOTE — Assessment & Plan Note (Addendum)
The nature of diabetic macular edema was discussed with the patient. Treatment options were outlined including medical therapy, laser & vitrectomy. The use of injectable medications reviewed, including Avastin, Lucentis, and Eylea. Periodic injections into the eye are likely to resolve diabetic macular edema (swelling in the center of vision). Initially, injections are delivered are delivered every 4-6 weeks, and the interval extended as the condition improves. On average, 8-9 injections the first year, and 5 in year 2. Improvement in the condition most often improves on medical therapy. Occasional use of focal laser is also recommended for residual macular edema (swelling). Excellent control of blood glucose and blood pressure are encouraged under the care of a primary physician or endocrinologist. Similarly, attempts to maintain serum cholesterol, low density lipoproteins, and high-density lipoproteins in a favorable range were recommended.   Like active CSME OS at 6-week interval post injection.  Has not responded quite as well as the right I did with the injections in the past.  We will continue to maintain to prevent vision loss with periodic injections to prevent progression of CSME largely temporal portion of the fovea

## 2020-12-15 NOTE — Assessment & Plan Note (Signed)

## 2020-12-15 NOTE — Assessment & Plan Note (Signed)
Similar findings OD has OS

## 2020-12-15 NOTE — Progress Notes (Addendum)
12/15/2020     CHIEF COMPLAINT Patient presents for Retina Follow Up (6 week fu OU and Avastin OS/Pt states VA OU stable since last visit. Pt denies FOL, floaters, or ocular pain OU. Karie Mainland: 6.7/LBS: 120)   HISTORY OF PRESENT ILLNESS: Damon Navarro is a 85 y.o. male who presents to the clinic today for:   HPI     Retina Follow Up           Diagnosis: Diabetic Retinopathy   Laterality: left eye   Onset: 6 weeks ago   Severity: mild   Duration: 6 weeks   Course: stable   Comments: 6 week fu OU and Avastin OS Pt states VA OU stable since last visit. Pt denies FOL, floaters, or ocular pain OU.  A1C: 6.7 LBS: 120       Last edited by Kendra Opitz, COA on 12/15/2020  8:27 AM.      Referring physician: Curlene Labrum, MD [place],  [place] [address]  HISTORICAL INFORMATION:   Selected notes from the MEDICAL RECORD NUMBER    Lab Results  Component Value Date   HGBA1C 7.0 (H) 10/07/2015     CURRENT MEDICATIONS: No current outpatient medications on file. (Ophthalmic Drugs)   No current facility-administered medications for this visit. (Ophthalmic Drugs)   Current Outpatient Medications (Other)  Medication Sig   budesonide-formoterol (SYMBICORT) 160-4.5 MCG/ACT inhaler Inhale 2 puffs into the lungs 2 (two) times daily.   calcium carbonate (TUMS EX) 750 MG chewable tablet Chew 1 tablet by mouth daily as needed for heartburn.   magnesium hydroxide (MILK OF MAGNESIA) 400 MG/5ML suspension Take 30 mLs by mouth daily as needed for mild constipation.   metFORMIN (GLUCOPHAGE) 500 MG tablet Take by mouth 2 (two) times daily with a meal.   traMADol-acetaminophen (ULTRACET) 37.5-325 MG tablet Take 1 tablet by mouth every 6 (six) hours as needed.   trimethoprim (TRIMPEX) 100 MG tablet Take 1 tablet (100 mg total) by mouth daily.   No current facility-administered medications for this visit. (Other)      REVIEW OF SYSTEMS:    ALLERGIES Allergies  Allergen  Reactions   Other Other (See Comments)    NO BLOOD PRODUCTS   Aspirin Hives    PAST MEDICAL HISTORY Past Medical History:  Diagnosis Date   Asthma    Bronchitis    last bout 2-3 weeks ago    COPD (chronic obstructive pulmonary disease) (HCC)    Diabetes mellitus without complication (HCC)    GERD (gastroesophageal reflux disease)    Hemorrhoids    Nocturia    Shortness of breath dyspnea    with congestion only    Urinary hesitancy    Past Surgical History:  Procedure Laterality Date   BACK SURGERY     NOSE SURGERY     PROSTATECTOMY N/A 10/14/2015   Procedure: PROSTATECTOMY TRANSVESICAL;  Surgeon: Carolan Clines, MD;  Location: [facility];  Service: Urology;  Laterality: N/A;    FAMILY HISTORY History reviewed. No pertinent family history.  SOCIAL HISTORY Social History   Tobacco Use   Smoking status: Never   Smokeless tobacco: Never  Substance Use Topics   Alcohol use: No   Drug use: No         OPHTHALMIC EXAM:  Base Eye Exam     Visual Acuity (ETDRS)       Right Left   Dist [place] 20/30 -2 20/30   Dist ph [place] NI 20/25 -  1         Tonometry (Tonopen, 8:30 AM)       Right Left   Pressure 13 10         Pupils       Pupils Dark Light Shape React APD   Right PERRL 4 3 Round Slow None   Left PERRL 4 3 Round Slow None         Visual Fields (Counting fingers)       Left Right    Full Full         Extraocular Movement       Right Left    Full Full         Neuro/Psych     Oriented x3: Yes   Mood/Affect: Normal         Dilation     Both eyes: 1.0% Mydriacyl, 2.5% Phenylephrine @ 8:30 AM           Slit Lamp and Fundus Exam     External Exam       Right Left   External Normal Normal         Slit Lamp Exam       Right Left   Lids/Lashes Normal Normal   Conjunctiva/Sclera White and quiet White and quiet   Cornea Clear Clear   Anterior Chamber Deep and quiet Deep and quiet   Iris Round and reactive Round and  reactive   Lens 2+ Nuclear sclerosis 2+ Nuclear sclerosis   Anterior Vitreous Normal Normal         Fundus Exam       Right Left   Posterior Vitreous Central vitreous floaters, Posterior vitreous detachment Posterior vitreous detachment   Disc Normal Normal   C/D Ratio 0.35 0.4   Macula  Mild clinically significant macular edema, Microaneurysms,, Exudates temporal portion of macula   Vessels   NPDR-Severe   Periphery  Normal            IMAGING AND PROCEDURES  Imaging and Procedures for 12/15/20  OCT, Retina - OU - Both Eyes       Right Eye Quality was good. Scan locations included subfoveal. Central Foveal Thickness: 261. Progression has been stable. Findings include abnormal foveal contour, cystoid macular edema.   Left Eye Quality was good. Scan locations included subfoveal. Central Foveal Thickness: 348. Progression has improved. Findings include abnormal foveal contour, cystoid macular edema.   Notes CSME OU, stable OD and minor will observe for now and left eye needs treatment today at 6-week interval with intravitreal Avastin       Intravitreal Injection, Pharmacologic Agent - OS - Left Eye       Time Out 12/15/2020. 9:12 AM. Confirmed correct patient, procedure, site, and patient consented.   Anesthesia Topical anesthesia was used. Anesthetic medications included [place] 3.5%.   Procedure Preparation included Ofloxacin , 10% betadine to eyelids, 5% betadine to ocular surface. A 30 gauge needle was used.   Injection: 2.5 mg bevacizumab 2.5 MG/0.1ML   Route: Intravitreal   NDC: 63845-364-68, Lot: [id number]   Post-op Post injection exam found visual acuity of at least counting fingers. The patient tolerated the procedure well. There were no complications. The patient received written and verbal post procedure care education. Post injection medications were not given.              ASSESSMENT/PLAN:  Nuclear sclerotic cataract of left eye The nature  of cataract was discussed with the patient as well as  the elective nature of surgery. The patient was reassured that surgery at a later date does not put the patient at risk for a worse outcome. It was emphasized that the need for surgery is dictated by the patient's quality of life as influenced by the cataract. Patient was instructed to maintain close follow up with their general eye care doctor.  Severe nonproliferative diabetic retinopathy of left eye, with macular edema, associated with type 2 diabetes mellitus ([place])  The nature of diabetic macular edema was discussed with the patient. Treatment options were outlined including medical therapy, laser & vitrectomy. The use of injectable medications reviewed, including Avastin, Lucentis, and Eylea. Periodic injections into the eye are likely to resolve diabetic macular edema (swelling in the center of vision). Initially, injections are delivered are delivered every 4-6 weeks, and the interval extended as the condition improves. On average, 8-9 injections the first year, and 5 in year 2. Improvement in the condition most often improves on medical therapy. Occasional use of focal laser is also recommended for residual macular edema (swelling). Excellent control of blood glucose and blood pressure are encouraged under the care of a primary physician or endocrinologist. Similarly, attempts to maintain serum cholesterol, low density lipoproteins, and high-density lipoproteins in a favorable range were recommended.   Like active CSME OS at 6-week interval post injection.  Has not responded quite as well as the right I did with the injections in the past.  We will continue to maintain to prevent vision loss with periodic injections to prevent progression of CSME largely temporal portion of the fovea   Severe nonproliferative diabetic retinopathy of right eye, with macular edema, associated with type 2 diabetes mellitus (HCC) Stable, no progression no active  CSME  Nuclear sclerotic cataract of right eye Similar findings OD has OS     ICD-10-CM   1. Severe nonproliferative diabetic retinopathy of left eye, with macular edema, associated with type 2 diabetes mellitus (HCC)  J33.5456 OCT, Retina - OU - Both Eyes    Intravitreal Injection, Pharmacologic Agent - OS - Left Eye    bevacizumab (AVASTIN) SOSY 2.5 mg    2. Nuclear sclerotic cataract of left eye  H25.12     3. Severe nonproliferative diabetic retinopathy of right eye, with macular edema, associated with type 2 diabetes mellitus ([place])  E11.3411     4. Nuclear sclerotic cataract of right eye  H25.11       1.  We will repeat intravitreal Avastin OS today to maintain and prevent progression of CSME temporal aspect of the fovea.  2.  MAC-TEL can look like this is well again patient denies any snoring or sleep apnea at this time  3.  We will repeat examination in 6 weeks with fluorescein angiography of each eye as well to look for signs of treatable lesions with laser treatment to diminish vegF load perhaps perhaps make the macular edema more responsive to antivegF therapy  4.  Patient advised to follow-up with my eye doctor in [place] or even at any time for his previous eye care to evaluate whether cataract surgery would be appropriate at any time in the near future  Ophthalmic Meds Ordered this visit:  Meds ordered this encounter  Medications   bevacizumab (AVASTIN) SOSY 2.5 mg       Return in about 6 weeks (around 01/26/2021) for DILATE OU, OPTOS FFA L/R, COLOR FP, AVASTIN OCT, OS.  There are no Patient Instructions on file for this visit.   Explained  the diagnoses, plan, and follow up with the patient and they expressed understanding.  Patient expressed understanding of the importance of proper follow up care.   Clent Demark Osiah Haring M.D. Diseases & Surgery of the Retina and Vitreous [facility]ina [facility] Diabetic [place] 12/15/20     Abbreviations: M myopia (nearsighted); A  astigmatism; H hyperopia (farsighted); P presbyopia; Mrx spectacle prescription;  CTL contact lenses; OD right eye; OS left eye; OU both eyes  XT exotropia; ET esotropia; PEK punctate epithelial keratitis; PEE punctate epithelial erosions; DES dry eye syndrome; MGD meibomian gland dysfunction; ATs artificial tears; PFAT's preservative free artificial tears; [place] nuclear sclerotic cataract; PSC posterior subcapsular cataract; ERM epi-retinal membrane; PVD posterior vitreous detachment; RD retinal detachment; DM diabetes mellitus; DR diabetic retinopathy; NPDR non-proliferative diabetic retinopathy; PDR proliferative diabetic retinopathy; CSME clinically significant macular edema; DME diabetic macular edema; dbh dot blot hemorrhages; CWS cotton wool spot; POAG primary open angle glaucoma; C/D cup-to-disc ratio; HVF humphrey visual field; GVF goldmann visual field; OCT optical coherence tomography; IOP intraocular pressure; BRVO Branch retinal vein occlusion; CRVO central retinal vein occlusion; CRAO central retinal artery occlusion; BRAO branch retinal artery occlusion; RT retinal tear; SB scleral buckle; PPV pars plana vitrectomy; VH Vitreous hemorrhage; PRP panretinal laser photocoagulation; IVK intravitreal kenalog; VMT vitreomacular traction; MH Macular hole;  NVD neovascularization of the disc; NVE neovascularization elsewhere; AREDS age related eye disease study; ARMD age related macular degeneration; POAG primary open angle glaucoma; EBMD epithelial/anterior basement membrane dystrophy; ACIOL anterior chamber intraocular lens; IOL intraocular lens; PCIOL posterior chamber intraocular lens; Phaco/IOL phacoemulsification with intraocular lens placement; [place] photorefractive keratectomy; LASIK laser assisted in situ keratomileusis; HTN hypertension; DM diabetes mellitus; COPD chronic obstructive pulmonary disease

## 2020-12-30 DEATH — deceased

## 2021-01-26 ENCOUNTER — Encounter (INDEPENDENT_AMBULATORY_CARE_PROVIDER_SITE_OTHER): Payer: PPO | Admitting: Ophthalmology
